# Patient Record
Sex: Male | Born: 1980 | Race: Black or African American | Hispanic: No | Marital: Single | Smoking: Never smoker
Health system: Southern US, Community
[De-identification: ages and names within clinical notes are randomized; demographics above are authoritative.]

## PROBLEM LIST (undated history)

## (undated) DIAGNOSIS — I10 Essential (primary) hypertension: Secondary | ICD-10-CM

---

## 2000-06-30 ENCOUNTER — Emergency Department (HOSPITAL_COMMUNITY): Admission: EM | Admit: 2000-06-30 | Discharge: 2000-07-01 | Payer: Self-pay | Admitting: Emergency Medicine

## 2005-05-28 ENCOUNTER — Emergency Department (HOSPITAL_COMMUNITY): Admission: EM | Admit: 2005-05-28 | Discharge: 2005-05-29 | Payer: Self-pay | Admitting: Emergency Medicine

## 2005-12-18 ENCOUNTER — Emergency Department (HOSPITAL_COMMUNITY): Admission: EM | Admit: 2005-12-18 | Discharge: 2005-12-18 | Payer: Self-pay | Admitting: Emergency Medicine

## 2006-04-25 ENCOUNTER — Ambulatory Visit: Payer: Self-pay | Admitting: Family Medicine

## 2006-04-25 DIAGNOSIS — I10 Essential (primary) hypertension: Secondary | ICD-10-CM

## 2006-04-25 DIAGNOSIS — K219 Gastro-esophageal reflux disease without esophagitis: Secondary | ICD-10-CM | POA: Insufficient documentation

## 2006-04-25 DIAGNOSIS — E785 Hyperlipidemia, unspecified: Secondary | ICD-10-CM | POA: Insufficient documentation

## 2006-04-26 ENCOUNTER — Encounter (INDEPENDENT_AMBULATORY_CARE_PROVIDER_SITE_OTHER): Payer: Self-pay | Admitting: Family Medicine

## 2006-05-01 ENCOUNTER — Encounter (INDEPENDENT_AMBULATORY_CARE_PROVIDER_SITE_OTHER): Payer: Self-pay | Admitting: Family Medicine

## 2006-05-01 LAB — CONVERTED CEMR LAB
ALT: 16 units/L (ref 0–53)
Albumin: 4.3 g/dL (ref 3.5–5.2)
BUN: 14 mg/dL (ref 6–23)
Eosinophils Relative: 2 % (ref 0–5)
HCT: 44.4 % — ABNORMAL HIGH (ref 33.0–44.0)
Hemoglobin: 14.5 g/dL (ref 11.0–14.6)
Lymphocytes Relative: 41 % (ref 31–63)
Lymphs Abs: 2.3 10*3/uL (ref 1.5–7.5)
Monocytes Absolute: 0.4 10*3/uL (ref 0.2–1.2)
Neutrophils Relative %: 49 % (ref 33–67)
Platelets: 348 10*3/uL (ref 190–420)
Potassium: 4.2 meq/L (ref 3.5–5.3)
RBC: 4.7 M/uL (ref 3.80–5.20)
RDW: 13.6 % (ref 11.3–13.6)
Total Bilirubin: 0.6 mg/dL (ref 0.3–1.2)
Total CHOL/HDL Ratio: 5.9
Total Protein: 7.4 g/dL (ref 6.0–8.3)
Triglycerides: 123 mg/dL (ref <150)

## 2006-05-23 ENCOUNTER — Ambulatory Visit: Payer: Self-pay | Admitting: Family Medicine

## 2006-05-24 ENCOUNTER — Encounter (INDEPENDENT_AMBULATORY_CARE_PROVIDER_SITE_OTHER): Payer: Self-pay | Admitting: Family Medicine

## 2006-05-24 LAB — CONVERTED CEMR LAB
BUN: 18 mg/dL (ref 6–23)
CO2: 19 meq/L (ref 19–32)
Calcium: 9.5 mg/dL (ref 8.4–10.5)
Creatinine, Ser: 1.36 mg/dL (ref 0.40–1.50)
Glucose, Bld: 72 mg/dL (ref 70–99)

## 2006-06-20 ENCOUNTER — Encounter (INDEPENDENT_AMBULATORY_CARE_PROVIDER_SITE_OTHER): Payer: Self-pay | Admitting: Family Medicine

## 2006-12-16 ENCOUNTER — Ambulatory Visit: Payer: Self-pay | Admitting: Family Medicine

## 2006-12-16 DIAGNOSIS — T22019A Burn of unspecified degree of unspecified forearm, initial encounter: Secondary | ICD-10-CM | POA: Insufficient documentation

## 2006-12-16 LAB — CONVERTED CEMR LAB: LDL Goal: 130 mg/dL

## 2008-03-05 ENCOUNTER — Ambulatory Visit: Payer: Self-pay | Admitting: Family Medicine

## 2008-03-05 DIAGNOSIS — R252 Cramp and spasm: Secondary | ICD-10-CM | POA: Insufficient documentation

## 2008-03-11 ENCOUNTER — Encounter (INDEPENDENT_AMBULATORY_CARE_PROVIDER_SITE_OTHER): Payer: Self-pay | Admitting: Family Medicine

## 2008-03-12 LAB — CONVERTED CEMR LAB
Chloride: 103 meq/L (ref 96–112)
Cholesterol: 184 mg/dL (ref 0–200)
Eosinophils Absolute: 0.3 10*3/uL (ref 0.0–0.7)
Eosinophils Relative: 4 % (ref 0–5)
Glucose, Bld: 101 mg/dL — ABNORMAL HIGH (ref 70–99)
LDL Cholesterol: 124 mg/dL — ABNORMAL HIGH (ref 0–99)
Lymphocytes Relative: 30 % (ref 12–46)
Lymphs Abs: 2.1 10*3/uL (ref 0.7–4.0)
MCV: 92.8 fL (ref 78.0–100.0)
Monocytes Relative: 11 % (ref 3–12)
Potassium: 3.8 meq/L (ref 3.5–5.3)
RBC: 4.72 M/uL (ref 4.22–5.81)
TSH: 2.255 microintl units/mL (ref 0.350–4.50)
Total Bilirubin: 0.6 mg/dL (ref 0.3–1.2)
Total CHOL/HDL Ratio: 5.3
Triglycerides: 126 mg/dL (ref <150)
VLDL: 25 mg/dL (ref 0–40)
WBC: 7.2 10*3/uL (ref 4.0–10.5)

## 2008-04-16 ENCOUNTER — Ambulatory Visit: Payer: Self-pay | Admitting: Family Medicine

## 2008-04-16 DIAGNOSIS — R7309 Other abnormal glucose: Secondary | ICD-10-CM

## 2008-04-16 DIAGNOSIS — E786 Lipoprotein deficiency: Secondary | ICD-10-CM | POA: Insufficient documentation

## 2008-04-16 LAB — CONVERTED CEMR LAB
Blood in Urine, dipstick: NEGATIVE
Glucose, Urine, Semiquant: NEGATIVE
Ketones, urine, test strip: NEGATIVE
Specific Gravity, Urine: 1.02
pH: 6

## 2008-05-28 ENCOUNTER — Ambulatory Visit: Payer: Self-pay | Admitting: Family Medicine

## 2008-07-30 ENCOUNTER — Ambulatory Visit: Payer: Self-pay | Admitting: Family Medicine

## 2009-12-28 ENCOUNTER — Emergency Department (HOSPITAL_COMMUNITY)
Admission: EM | Admit: 2009-12-28 | Discharge: 2009-12-28 | Payer: Self-pay | Source: Home / Self Care | Admitting: Emergency Medicine

## 2010-02-05 LAB — CONVERTED CEMR LAB
Bilirubin Urine: NEGATIVE
Glucose, Urine, Semiquant: NEGATIVE
HDL goal, serum: 40 mg/dL
Ketones, urine, test strip: NEGATIVE
Nitrite: NEGATIVE
Protein, U semiquant: NEGATIVE
WBC Urine, dipstick: NEGATIVE

## 2012-10-17 ENCOUNTER — Emergency Department (HOSPITAL_COMMUNITY)
Admission: EM | Admit: 2012-10-17 | Discharge: 2012-10-17 | Disposition: A | Discharge status: Home / Self Care | Payer: Self-pay | Attending: Emergency Medicine | Admitting: Emergency Medicine

## 2012-10-17 ENCOUNTER — Encounter (HOSPITAL_COMMUNITY): Payer: Self-pay | Admitting: Emergency Medicine

## 2012-10-17 DIAGNOSIS — X58XXXA Exposure to other specified factors, initial encounter: Secondary | ICD-10-CM | POA: Insufficient documentation

## 2012-10-17 DIAGNOSIS — M549 Dorsalgia, unspecified: Secondary | ICD-10-CM | POA: Insufficient documentation

## 2012-10-17 DIAGNOSIS — Y929 Unspecified place or not applicable: Secondary | ICD-10-CM | POA: Insufficient documentation

## 2012-10-17 DIAGNOSIS — Y939 Activity, unspecified: Secondary | ICD-10-CM | POA: Insufficient documentation

## 2012-10-17 DIAGNOSIS — T148XXA Other injury of unspecified body region, initial encounter: Secondary | ICD-10-CM | POA: Insufficient documentation

## 2012-10-17 MED ORDER — CYCLOBENZAPRINE HCL 10 MG PO TABS: 10.0000 mg | ORAL_TABLET | Freq: Three times a day (TID) | ORAL | Status: DC | PRN

## 2012-10-17 MED ORDER — DICLOFENAC SODIUM 75 MG PO TBEC: 75.0000 mg | DELAYED_RELEASE_TABLET | Freq: Two times a day (BID) | ORAL | Status: DC

## 2012-10-17 NOTE — ED Notes (Signed)
Patient complaining of knot to left shoulder and pain x 1 week. Denies injury.

## 2012-10-19 NOTE — ED Provider Notes (Signed)
Medical screening examination/treatment/procedure(s) were performed by non-physician practitioner and as supervising physician I was immediately available for consultation/collaboration.    Vida Roller, MD 10/19/12 2258

## 2012-10-19 NOTE — ED Provider Notes (Signed)
CSN: [id number]     Arrival date & time 10/17/12  1850 History   First MD Initiated Contact with Patient 10/17/12 1904     Chief Complaint  Patient presents with  . Shoulder Pain   (Consider location/radiation/quality/duration/timing/severity/associated sxs/prior Treatment) Patient is a 32 y.o. male presenting with shoulder pain. The history is provided by the patient.  Shoulder Pain This is a new problem. Episode onset: one week. The problem occurs constantly. The problem has been unchanged. Associated symptoms include myalgias. Pertinent negatives include no abdominal pain, chest pain, chills, congestion, diaphoresis, fever, headaches, joint swelling, nausea, neck pain, numbness, rash, sore throat, vertigo, visual change, vomiting or weakness. The symptoms are aggravated by twisting (bending, stretching and movement of the left arm). He has tried nothing for the symptoms. The treatment provided no relief.    History reviewed. No pertinent past medical history. History reviewed. No pertinent past surgical history. History reviewed. No pertinent family history. History  Substance Use Topics  . Smoking status: Never Smoker   . Smokeless tobacco: Not on file  . Alcohol Use: Yes     Comment: occasional    Review of Systems  Constitutional: Negative for fever, chills and diaphoresis.  HENT: Negative for congestion and sore throat.   Respiratory: Negative for chest tightness and shortness of breath.   Cardiovascular: Negative for chest pain.  Gastrointestinal: Negative for nausea, vomiting and abdominal pain.  Genitourinary: Negative for dysuria and difficulty urinating.  Musculoskeletal: Positive for back pain and myalgias. Negative for joint swelling, neck pain and neck stiffness.  Skin: Negative for color change, rash and wound.  Neurological: Negative for dizziness, vertigo, weakness, numbness and headaches.  All other systems reviewed and are negative.    Allergies  Review of  patient's allergies indicates no known allergies.  Home Medications   Current Outpatient Rx  Name  Route  Sig  Dispense  Refill  . cyclobenzaprine (FLEXERIL) 10 MG tablet   Oral   Take 1 tablet (10 mg total) by mouth 3 (three) times daily as needed.   21 tablet   0   . diclofenac (VOLTAREN) 75 MG EC tablet   Oral   Take 1 tablet (75 mg total) by mouth 2 (two) times daily. Take with food   14 tablet   0    BP 162/79  Pulse 89  Temp(Src) 98.1 F (36.7 C) (Oral)  Resp 16  Ht 6' (1.829 m)  Wt 274 lb (124.286 kg)  BMI 37.15 kg/m2  SpO2 99% Physical Exam  Nursing note and vitals reviewed. Constitutional: He is oriented to person, place, and time. He appears well-developed and well-nourished. No distress.  HENT:  Head: Normocephalic and atraumatic.  Neck: Normal range of motion. Neck supple. No thyromegaly present.  Cardiovascular: Normal rate, regular rhythm, normal heart sounds and intact distal pulses.   No murmur heard. Pulmonary/Chest: Effort normal and breath sounds normal. No respiratory distress. He exhibits no tenderness.  Musculoskeletal: He exhibits tenderness. He exhibits no edema.       Back:  Localized ttp along the muscular border of the left scapula.    Radial pulse is brisk, distal sensation intact, CR< 2 sec. Grip strength is strong and symmetrical.   Pt has full ROM of the left shoulder and neck.  No erythema or bony deformities.     Lymphadenopathy:    He has no cervical adenopathy.  Neurological: He is alert and oriented to person, place, and time. He has normal strength. No sensory  deficit. He exhibits normal muscle tone. Coordination and gait normal.  Reflex Scores:      Tricep reflexes are 2+ on the right side and 2+ on the left side.      Bicep reflexes are 2+ on the right side and 2+ on the left side. Skin: Skin is warm and dry.    ED Course  Procedures (including critical care time) Labs Review Labs Reviewed - No data to display Imaging  Review No results found.    MDM   1. Muscle strain      Pt has localized ttp of the left scapula border.  Pt has full ROM of the left arm and neck.  No focal neuro deficits, non-toxic appearing.  Sx's are c/w muscular strain.  No hx of trauma to indicate need for imaging at this time.  Will treat symptomatically with flexeril and voltaren.  Pt agrees to ice, rest and ortho f/u with not improving.  Pt appears stable for discharge and agrees to care plan.    Paiton Fosco L. Trisha Mangle, PA-C 10/19/12 1257

## 2012-11-16 ENCOUNTER — Emergency Department (HOSPITAL_COMMUNITY)
Admission: EM | Admit: 2012-11-16 | Discharge: 2012-11-16 | Disposition: A | Discharge status: Home / Self Care | Payer: Managed Care, Other (non HMO) | Attending: Emergency Medicine | Admitting: Emergency Medicine

## 2012-11-16 ENCOUNTER — Encounter (HOSPITAL_COMMUNITY): Payer: Self-pay | Admitting: Emergency Medicine

## 2012-11-16 ENCOUNTER — Emergency Department (HOSPITAL_COMMUNITY): Payer: Managed Care, Other (non HMO)

## 2012-11-16 DIAGNOSIS — W230XXA Caught, crushed, jammed, or pinched between moving objects, initial encounter: Secondary | ICD-10-CM | POA: Insufficient documentation

## 2012-11-16 DIAGNOSIS — Z23 Encounter for immunization: Secondary | ICD-10-CM | POA: Insufficient documentation

## 2012-11-16 DIAGNOSIS — Z791 Long term (current) use of non-steroidal anti-inflammatories (NSAID): Secondary | ICD-10-CM | POA: Insufficient documentation

## 2012-11-16 DIAGNOSIS — S6000XA Contusion of unspecified finger without damage to nail, initial encounter: Secondary | ICD-10-CM | POA: Insufficient documentation

## 2012-11-16 DIAGNOSIS — Y990 Civilian activity done for income or pay: Secondary | ICD-10-CM | POA: Insufficient documentation

## 2012-11-16 DIAGNOSIS — Y9389 Activity, other specified: Secondary | ICD-10-CM | POA: Insufficient documentation

## 2012-11-16 DIAGNOSIS — Y9289 Other specified places as the place of occurrence of the external cause: Secondary | ICD-10-CM | POA: Insufficient documentation

## 2012-11-16 DIAGNOSIS — S62609A Fracture of unspecified phalanx of unspecified finger, initial encounter for closed fracture: Secondary | ICD-10-CM

## 2012-11-16 DIAGNOSIS — S62639A Displaced fracture of distal phalanx of unspecified finger, initial encounter for closed fracture: Secondary | ICD-10-CM | POA: Insufficient documentation

## 2012-11-16 DIAGNOSIS — S6010XA Contusion of unspecified finger with damage to nail, initial encounter: Secondary | ICD-10-CM

## 2012-11-16 MED ORDER — TETANUS-DIPHTH-ACELL PERTUSSIS 5-2.5-18.5 LF-MCG/0.5 IM SUSP
0.5000 mL | Freq: Once | INTRAMUSCULAR | Status: AC
  Administered 2012-11-16: 0.5 mL via INTRAMUSCULAR
  Filled 2012-11-16: qty 0.5

## 2012-11-16 NOTE — ED Notes (Signed)
Pt was rolling a cart at work when he went to stop the cart from rolling too fast smashing index, middle and ring finger on left hand, c/o pain and swelling to left middle finger,

## 2012-11-16 NOTE — ED Provider Notes (Signed)
Medical screening examination/treatment/procedure(s) were performed by non-physician practitioner and as supervising physician I was immediately available for consultation/collaboration.  EKG Interpretation   None         Anis Cinelli L Syndi Pua, MD 11/16/12 2342 

## 2012-11-16 NOTE — ED Provider Notes (Signed)
CSN: [id number]     Arrival date & time 11/16/12  1728 History   First MD Initiated Contact with Patient 11/16/12 1742     Chief Complaint  Patient presents with  . Finger Injury   (Consider location/radiation/quality/duration/timing/severity/associated sxs/prior Treatment) Patient is a 32 y.o. male presenting with hand pain. The history is provided by the patient.  Hand Pain This is a new problem. The current episode started in the past 7 days. The problem has been gradually worsening.   Carlos Griffin is a 32 y.o. male who presents to the ED with pain in his middle finger of the left hand after he mashed it in a shopping cart 4 days ago. He has swelling and blood under his finer nail. He did hut the index and ring finger but they are fine now. Denies any other injuries.   History reviewed. No pertinent past medical history. History reviewed. No pertinent past surgical history. No family history on file. History  Substance Use Topics  . Smoking status: Never Smoker   . Smokeless tobacco: Not on file  . Alcohol Use: Yes     Comment: occasional    Review of Systems Negative except as stated in HPI  Allergies  Review of patient's allergies indicates no known allergies.  Home Medications   Current Outpatient Rx  Name  Route  Sig  Dispense  Refill  . cyclobenzaprine (FLEXERIL) 10 MG tablet   Oral   Take 1 tablet (10 mg total) by mouth 3 (three) times daily as needed.   21 tablet   0   . diclofenac (VOLTAREN) 75 MG EC tablet   Oral   Take 1 tablet (75 mg total) by mouth 2 (two) times daily. Take with food   14 tablet   0    BP 155/92  Pulse 85  Temp(Src) 98.1 F (36.7 C) (Oral)  Resp 20  Ht 6' (1.829 m)  Wt 260 lb (117.935 kg)  BMI 35.25 kg/m2  SpO2 100% Physical Exam  Nursing note and vitals reviewed. Constitutional: He is oriented to person, place, and time. He appears well-developed and well-nourished. No distress.  HENT:  Head: Normocephalic and  atraumatic.  Eyes: EOM are normal.  Neck: Neck supple.  Cardiovascular: Normal rate.   Pulmonary/Chest: Effort normal.  Musculoskeletal:       Left hand: He exhibits tenderness and swelling. He exhibits normal range of motion and no deformity. Normal sensation noted. Normal strength noted.       Hands: subungual hematoma  Neurological: He is alert and oriented to person, place, and time. No cranial nerve deficit.  Skin: Skin is warm and dry.  Psychiatric: He has a normal mood and affect. His behavior is normal.    ED Course  Procedures Long finger left hand cleaned, using cautery small hole made in nail and subungual hematoma drained. Patient tolerated the procedure well without any immediate complications.   Dg Finger Middle Left  11/16/2012   CLINICAL DATA:  Left middle finger injury  EXAM: LEFT MIDDLE FINGER 2+V  COMPARISON:  None.  FINDINGS: Three views of the left 3rd finger submitted. There is nondisplaced fracture at the tip of distal phalanx.  IMPRESSION: Nondisplaced fracture at the tip of distal phalanx.   Electronically Signed   By: Natasha Mead M.D.   On: 11/16/2012 18:30    MDM  32 y.o. male with subungual hematoma of the left middle finger and fracture of the distal phalanx. Splint, ice and elevation and  follow up with ortho.  I have reviewed this patient's vital signs, nurses notes, appropriate labs and imaging.  I have discussed findings with the patient and plan of care and he voices understanding.    Medication List    ASK your doctor about these medications       cyclobenzaprine 10 MG tablet  Commonly known as:  FLEXERIL  Take 1 tablet (10 mg total) by mouth 3 (three) times daily as needed.     diclofenac 75 MG EC tablet  Commonly known as:  VOLTAREN  Take 1 tablet (75 mg total) by mouth 2 (two) times daily. Take with food           Janne Napoleon, NP 11/16/12 2242

## 2014-04-11 ENCOUNTER — Encounter (HOSPITAL_COMMUNITY): Payer: Self-pay | Admitting: Cardiology

## 2014-04-11 ENCOUNTER — Emergency Department (HOSPITAL_COMMUNITY)
Admission: EM | Admit: 2014-04-11 | Discharge: 2014-04-11 | Disposition: A | Discharge status: Home / Self Care | Payer: Managed Care, Other (non HMO) | Attending: Emergency Medicine | Admitting: Emergency Medicine

## 2014-04-11 DIAGNOSIS — Z792 Long term (current) use of antibiotics: Secondary | ICD-10-CM | POA: Insufficient documentation

## 2014-04-11 DIAGNOSIS — J020 Streptococcal pharyngitis: Secondary | ICD-10-CM

## 2014-04-11 DIAGNOSIS — J029 Acute pharyngitis, unspecified: Secondary | ICD-10-CM | POA: Insufficient documentation

## 2014-04-11 LAB — RAPID STREP SCREEN (MED CTR MEBANE ONLY): Streptococcus, Group A Screen (Direct): POSITIVE — AB

## 2014-04-11 MED ORDER — AMOXICILLIN 500 MG PO CAPS: 500.0000 mg | ORAL_CAPSULE | Freq: Three times a day (TID) | ORAL | Status: DC

## 2014-04-11 NOTE — ED Provider Notes (Signed)
CSN: [id number]     Arrival date & time 04/11/14  1051 History   This chart was scribed for non-physician practitioner Kerrie BuffaloHope Sirena Riddle, NP working with Donnetta HutchingBrian Cook, MD by Murriel HopperAlec Bankhead, ED Scribe. This patient was seen in room APFT23/APFT23 and the patient's care was started at 12:20 PM.    Chief Complaint  Patient presents with  . Sore Throat  . Chills      The history is provided by the patient. No language interpreter was used.     HPI Comments: Carlos Griffin is a 34 y.o. male who presents to the Emergency Department complaining of a sore throat with associated chills, fever that has been present since last night. Pt denies positive sick contact and denies ear pain, cough, and congestion.    History reviewed. No pertinent past medical history. History reviewed. No pertinent past surgical history. History reviewed. No pertinent family history. History  Substance Use Topics  . Smoking status: Never Smoker   . Smokeless tobacco: Not on file  . Alcohol Use: Yes     Comment: occasional    Review of Systems  Constitutional: Positive for fever and chills.  HENT: Positive for sore throat. Negative for congestion and ear pain.   Respiratory: Negative for cough.       Allergies  Review of patient's allergies indicates no known allergies.  Home Medications   Prior to Admission medications   Medication Sig Start Date End Date Taking? Authorizing Provider  amoxicillin (AMOXIL) 500 MG capsule Take 1 capsule (500 mg total) by mouth 3 (three) times daily. 04/11/14   Ady Heimann Orlene OchM Jayliana Valencia, NP  cyclobenzaprine (FLEXERIL) 10 MG tablet Take 1 tablet (10 mg total) by mouth 3 (three) times daily as needed. Patient not taking: Reported on 04/11/2014 10/17/12   Tammi Triplett, PA-C  diclofenac (VOLTAREN) 75 MG EC tablet Take 1 tablet (75 mg total) by mouth 2 (two) times daily. Take with food Patient not taking: Reported on 04/11/2014 10/17/12   Tammi Triplett, PA-C   BP 131/69 mmHg  Pulse 106  Temp(Src)  100.2 F (37.9 C) (Oral)  Resp 18  Ht 5\' 11"  (1.803 m)  Wt 280 lb (127.007 kg)  BMI 39.07 kg/m2  SpO2 99% Physical Exam  Constitutional: He is oriented to person, place, and time. He appears well-developed and well-nourished.  HENT:  Head: Normocephalic and atraumatic.  Mouth/Throat: Uvula is midline. Oropharyngeal exudate and posterior oropharyngeal erythema present.  Uvula midline Pharynx is red with   Cervical adenopathy bilateral   Eyes: Pupils are equal, round, and reactive to light.  Cardiovascular: Normal rate and regular rhythm.   Pulmonary/Chest: Effort normal and breath sounds normal. No respiratory distress. He has no wheezes. He has no rales. He exhibits no tenderness.  Abdominal: Soft. He exhibits no distension. There is no tenderness.  Musculoskeletal:  No CVA tenderness  Neurological: He is alert and oriented to person, place, and time.  Skin: Skin is warm and dry.  Psychiatric: He has a normal mood and affect.  Nursing note and vitals reviewed.   ED Course  Procedures (including critical care time)  DIAGNOSTIC STUDIES: Oxygen Saturation is 99% on room air, normal by my interpretation.    COORDINATION OF CARE: 12:23 PM Discussed treatment plan with pt at bedside and pt agreed to plan.   Labs Review Labs Reviewed  RAPID STREP SCREEN - Abnormal; Notable for the following:    Streptococcus, Group A Screen (Direct) POSITIVE (*)    All other components within  normal limits     MDM  34 y.o. male with sore throat and fever x 24 hours. Stable for d/c without difficulty swallowing and does not appear toxic. Will treat strep with antibiotics and he will return for worsening symptoms.   Final diagnoses:  Strep pharyngitis   I personally performed the services described in this documentation, which was scribed in my presence. The recorded information has been reviewed and is accurate.   [address] [place], NP 04/12/14 Ernestina Columbia  Donnetta Hutching, MD 04/13/14 1255

## 2014-04-11 NOTE — Discharge Instructions (Signed)
Take ibuprofen regularly for the next few days for pain and fever. Return as needed.

## 2014-04-11 NOTE — ED Notes (Signed)
Sorethroat,  Chills and achy since yesterday.

## 2014-12-16 ENCOUNTER — Encounter (HOSPITAL_COMMUNITY): Payer: Self-pay | Admitting: Emergency Medicine

## 2014-12-16 ENCOUNTER — Emergency Department (HOSPITAL_COMMUNITY)
Admission: EM | Admit: 2014-12-16 | Discharge: 2014-12-16 | Disposition: A | Discharge status: Home / Self Care | Payer: Managed Care, Other (non HMO) | Attending: Emergency Medicine | Admitting: Emergency Medicine

## 2014-12-16 DIAGNOSIS — Z792 Long term (current) use of antibiotics: Secondary | ICD-10-CM | POA: Diagnosis not present

## 2014-12-16 DIAGNOSIS — I10 Essential (primary) hypertension: Secondary | ICD-10-CM

## 2014-12-16 DIAGNOSIS — R51 Headache: Secondary | ICD-10-CM | POA: Diagnosis present

## 2014-12-16 LAB — I-STAT CHEM 8, ED
BUN: 17 mg/dL (ref 6–20)
CALCIUM ION: 1.11 mmol/L — AB (ref 1.12–1.23)
CHLORIDE: 99 mmol/L — AB (ref 101–111)
CREATININE: 1.4 mg/dL — AB (ref 0.61–1.24)
GLUCOSE: 90 mg/dL (ref 65–99)
HCT: 47 % (ref 39.0–52.0)
Hemoglobin: 16 g/dL (ref 13.0–17.0)
Potassium: 3.5 mmol/L (ref 3.5–5.1)
SODIUM: 139 mmol/L (ref 135–145)
TCO2: 27 mmol/L (ref 0–100)

## 2014-12-16 MED ORDER — LISINOPRIL 20 MG PO TABS: 20.0000 mg | ORAL_TABLET | Freq: Every day | ORAL | Status: DC

## 2014-12-16 MED ORDER — CLONIDINE HCL 0.1 MG PO TABS
0.1000 mg | ORAL_TABLET | Freq: Once | ORAL | Status: AC
  Administered 2014-12-16: 0.1 mg via ORAL
  Filled 2014-12-16: qty 1

## 2014-12-16 NOTE — ED Notes (Signed)
Patient verbalizes understanding of discharge instructions, prescription medications, home care and follow up care. Patient ambulatory out of department at this time. 

## 2014-12-16 NOTE — Discharge Instructions (Signed)
Follow up with a family md in 1-2 weeks °

## 2014-12-16 NOTE — ED Provider Notes (Signed)
CSN: [id number]     Arrival date & time 12/16/14  2107 History  By signing my name below, I, Murriel HopperAlec Bankhead, attest that this documentation has been prepared under the direction and in the presence of Bethann BerkshireJoseph Chistian Kasler, MD. Electronically Signed: Murriel HopperAlec Bankhead, ED Scribe. 12/16/2014. 9:29 PM.    Chief Complaint  Patient presents with  . Headache  . Hypertension     Patient is a 34 y.o. male presenting with headaches and hypertension. The history is provided by the patient. No language interpreter was used.  Headache Pain location:  Generalized Radiates to:  Does not radiate Onset quality:  Gradual Duration:  4 days Timing:  Constant Progression:  Worsening Chronicity:  New Relieved by:  None tried Worsened by:  Nothing Ineffective treatments:  None tried Associated symptoms: no abdominal pain, no back pain, no congestion, no cough, no diarrhea, no fatigue, no nausea, no seizures, no sinus pressure, no vomiting and no weakness   Hypertension Associated symptoms include headaches. Pertinent negatives include no chest pain and no abdominal pain.   HPI Comments: Carlos Griffin is a 34 y.o. male who presents to the Emergency Department complaining of constant, worsening headache with associated hypertensive episodes that have been present for three datys. Pt reports his blood pressure was around 170/106 and states he does not take any medication for his HTN. Pt states he was prescribed HTN medication at one point but never got it filled as he states "I felt like I could control it on my own". Pt states he has a PCP. Pt denies weakness, visual changes, nausea, vomiting.   History reviewed. No pertinent past medical history. History reviewed. No pertinent past surgical history. History reviewed. No pertinent family history. Social History  Substance Use Topics  . Smoking status: Never Smoker   . Smokeless tobacco: None  . Alcohol Use: No     Comment: occasional    Review of Systems   Constitutional: Negative for appetite change and fatigue.  HENT: Negative for congestion, ear discharge and sinus pressure.   Eyes: Negative for discharge and visual disturbance.  Respiratory: Negative for cough.   Cardiovascular: Negative for chest pain.  Gastrointestinal: Negative for nausea, vomiting, abdominal pain and diarrhea.  Genitourinary: Negative for frequency and hematuria.  Musculoskeletal: Negative for back pain.  Skin: Negative for rash.  Neurological: Positive for headaches. Negative for seizures and weakness.  Psychiatric/Behavioral: Negative for hallucinations.      Allergies  Review of patient's allergies indicates no known allergies.  Home Medications   Prior to Admission medications   Medication Sig Start Date End Date Taking? Authorizing Provider  amoxicillin (AMOXIL) 500 MG capsule Take 1 capsule (500 mg total) by mouth 3 (three) times daily. 04/11/14   Hope Orlene OchM Neese, NP  cyclobenzaprine (FLEXERIL) 10 MG tablet Take 1 tablet (10 mg total) by mouth 3 (three) times daily as needed. Patient not taking: Reported on 04/11/2014 10/17/12   Tammy Triplett, PA-C  diclofenac (VOLTAREN) 75 MG EC tablet Take 1 tablet (75 mg total) by mouth 2 (two) times daily. Take with food Patient not taking: Reported on 04/11/2014 10/17/12   Tammy Triplett, PA-C   BP 185/112 mmHg  Pulse 78  Temp(Src) 97.7 F (36.5 C) (Oral)  Resp 18  Ht 5\' 11"  (1.803 m)  Wt 306 lb (138.801 kg)  BMI 42.70 kg/m2  SpO2 100% Physical Exam  Constitutional: He is oriented to person, place, and time. He appears well-developed.  HENT:  Head: Normocephalic.  Eyes: Conjunctivae  and EOM are normal. No scleral icterus.  Neck: Neck supple. No thyromegaly present.  Cardiovascular: Normal rate and regular rhythm.  Exam reveals no gallop and no friction rub.   No murmur heard. Pulmonary/Chest: No stridor. He has no wheezes. He has no rales. He exhibits no tenderness.  Abdominal: He exhibits no distension. There  is no tenderness. There is no rebound.  Musculoskeletal: Normal range of motion. He exhibits no edema.  Lymphadenopathy:    He has no cervical adenopathy.  Neurological: He is oriented to person, place, and time. He exhibits normal muscle tone. Coordination normal.  Skin: No rash noted. No erythema.  Psychiatric: He has a normal mood and affect. His behavior is normal.    ED Course  Procedures (including critical care time)  DIAGNOSTIC STUDIES: Oxygen Saturation is 100% on room air, normal by my interpretation.    COORDINATION OF CARE: 9:27 PM Discussed treatment plan with pt at bedside and pt agreed to plan.   Labs Review Labs Reviewed - No data to display  Imaging Review No results found. I have personally reviewed and evaluated these images and lab results as part of my medical decision-making.   EKG Interpretation None      MDM   Final diagnoses:  None   Patient with hypertension. Will start patient on lisinopril and he is follow-up with family M.D.The chart was scribed for me under my direct supervision.  I personally performed the history, physical, and medical decision making and all procedures in the evaluation of this patient.Bethann Berkshire, MD 12/16/14 [phone]

## 2014-12-16 NOTE — ED Notes (Signed)
Pt states he has been having a HA for the past three days and pt states his BP was "170s/106". Pt denies weakness, visual changes, N/V.

## 2015-03-07 ENCOUNTER — Ambulatory Visit: Payer: Self-pay | Admitting: Physician Assistant

## 2015-07-21 ENCOUNTER — Ambulatory Visit (INDEPENDENT_AMBULATORY_CARE_PROVIDER_SITE_OTHER): Payer: Managed Care, Other (non HMO) | Admitting: Physician Assistant

## 2015-07-21 ENCOUNTER — Encounter: Payer: Self-pay | Admitting: Physician Assistant

## 2015-07-21 VITALS — BP 164/114 | HR 72 | Temp 98.1°F | Resp 18 | Ht 71.0 in | Wt 290.0 lb

## 2015-07-21 DIAGNOSIS — E785 Hyperlipidemia, unspecified: Secondary | ICD-10-CM | POA: Diagnosis not present

## 2015-07-21 DIAGNOSIS — I10 Essential (primary) hypertension: Secondary | ICD-10-CM | POA: Diagnosis not present

## 2015-07-21 DIAGNOSIS — R7309 Other abnormal glucose: Secondary | ICD-10-CM

## 2015-07-21 DIAGNOSIS — Z0000 Encounter for general adult medical examination without abnormal findings: Secondary | ICD-10-CM

## 2015-07-21 LAB — COMPLETE METABOLIC PANEL WITH GFR
ALT: 15 U/L (ref 9–46)
AST: 16 U/L (ref 10–40)
Albumin: 4.2 g/dL (ref 3.6–5.1)
Alkaline Phosphatase: 52 U/L (ref 40–115)
BUN: 13 mg/dL (ref 7–25)
CHLORIDE: 104 mmol/L (ref 98–110)
CO2: 26 mmol/L (ref 20–31)
Calcium: 9.1 mg/dL (ref 8.6–10.3)
Creat: 1.49 mg/dL — ABNORMAL HIGH (ref 0.60–1.35)
GFR, EST AFRICAN AMERICAN: 69 mL/min (ref >=60)
GFR, EST NON AFRICAN AMERICAN: 60 mL/min (ref >=60)
GLUCOSE: 110 mg/dL — AB (ref 70–99)
POTASSIUM: 3.7 mmol/L (ref 3.5–5.3)
SODIUM: 139 mmol/L (ref 135–146)
TOTAL PROTEIN: 6.9 g/dL (ref 6.1–8.1)
Total Bilirubin: 0.6 mg/dL (ref 0.2–1.2)

## 2015-07-21 LAB — LIPID PANEL
CHOL/HDL RATIO: 5 ratio (ref <=5.0)
Cholesterol: 208 mg/dL — ABNORMAL HIGH (ref 125–200)
HDL: 42 mg/dL (ref >=40)
LDL CALC: 142 mg/dL — AB (ref <130)
TRIGLYCERIDES: 122 mg/dL (ref <150)
VLDL: 24 mg/dL (ref <30)

## 2015-07-21 LAB — CBC WITH DIFFERENTIAL/PLATELET
BASOS ABS: 0 {cells}/uL (ref 0–200)
Basophils Relative: 0 %
EOS PCT: 2 %
Eosinophils Absolute: 148 cells/uL (ref 15–500)
HCT: 43.7 % (ref 38.5–50.0)
Hemoglobin: 14.5 g/dL (ref 13.0–17.0)
LYMPHS PCT: 28 %
Lymphs Abs: 2072 cells/uL (ref 850–3900)
MCH: 30.7 pg (ref 27.0–33.0)
MCHC: 33.2 g/dL (ref 32.0–36.0)
MCV: 92.4 fL (ref 80.0–100.0)
MONOS PCT: 6 %
MPV: 10 fL (ref 7.5–12.5)
Monocytes Absolute: 444 cells/uL (ref 200–950)
NEUTROS ABS: 4736 {cells}/uL (ref 1500–7800)
Neutrophils Relative %: 64 %
PLATELETS: 337 10*3/uL (ref 140–400)
RBC: 4.73 MIL/uL (ref 4.20–5.80)
RDW: 13.3 % (ref 11.0–15.0)
WBC: 7.4 10*3/uL (ref 3.8–10.8)

## 2015-07-21 LAB — HEMOGLOBIN A1C
HEMOGLOBIN A1C: 5.1 % (ref <5.7)
Mean Plasma Glucose: 100 mg/dL

## 2015-07-21 LAB — TSH: TSH: 1.38 mIU/L (ref 0.40–4.50)

## 2015-07-21 MED ORDER — LISINOPRIL 40 MG PO TABS: 40.0000 mg | ORAL_TABLET | Freq: Every day | ORAL | Status: DC

## 2015-07-21 MED ORDER — AMLODIPINE BESYLATE 5 MG PO TABS: 5.0000 mg | ORAL_TABLET | Freq: Every day | ORAL | Status: DC

## 2015-07-21 NOTE — Progress Notes (Signed)
Patient ID: Carlos Griffin MRN: [id number], DOB: 03-20-1980 35 y.o. Date of Encounter: 07/21/2015, 8:55 AM    Chief Complaint: Physical (CPE)  HPI: 35 y.o. y/o AA male here for CPE.    -----------AT NEXT OV WILL REVIEW/DOCUMENT FAMILY HISTORY---------------------------- Reviewed that he is taking lisinopril 36 mg daily on his medication list. Verified-- he states that he IS taking this every day as directed. Says that he has been on this for 4-5 months. Says that he was seeing Dr. Felecia Shelling in [place] regarding this. Says that he has not been on any other blood pressure medicines in the past and has had no adverse effects to any other blood pressure medicines.  Also reviewed that his problem list includes hyperlipidemia. Says that this is just been treated with "diet" and has never been on medicines for this. Asked when this was last checked and he says at his wellness program at work at around January of this year.  Also discussed his weight and his diet regarding the hyperlipidemia. Asked whether his diet is better worse or the same. He says it has improved some. I also asked if his weight is about the same or if it is much different than it was 5 years ago. Says that it's about the same.  Discussed that his blood pressure is reading high today. States that he has checked his blood pressure at work and at home and has been getting similar readings.  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 His 74 year old son lives with him. It is just the 2 of them. Does make mention that his son's mother lives in [place].   Review of Systems: Consitutional: No fever, chills, fatigue, night sweats, lymphadenopathy, or weight changes. Eyes: No visual changes, eye redness, or discharge. ENT/Mouth: Ears: No otalgia, tinnitus, hearing loss, discharge. Nose: No congestion, rhinorrhea, sinus pain, or epistaxis. Throat: No sore throat, post nasal  drip, or teeth pain. Cardiovascular: No CP, palpitations, diaphoresis, DOE, edema, orthopnea, PND. Respiratory: No cough, hemoptysis, SOB, or wheezing. Gastrointestinal: No anorexia, dysphagia, reflux, pain, nausea, vomiting, hematemesis, diarrhea, constipation, BRBPR, or melena. Genitourinary: No dysuria, frequency, urgency, hematuria, incontinence, nocturia, decreased urinary stream, discharge, impotence, or testicular pain/masses. Musculoskeletal: No decreased ROM, myalgias, stiffness, joint swelling, or weakness. Skin: No rash, erythema, lesion changes, pain, warmth, jaundice, or pruritis. Neurological: No headache, dizziness, syncope, seizures, tremors, memory loss, coordination problems, or paresthesias. Psychological: No anxiety, depression, hallucinations, SI/HI. Endocrine: No fatigue, polydipsia, polyphagia, polyuria, or known diabetes. All other systems were reviewed and are otherwise negative.  No past medical history on file.   No past surgical history on file.  Home Meds:  Outpatient Prescriptions Prior to Visit  Medication Sig Dispense Refill  . lisinopril (PRINIVIL,ZESTRIL) 20 MG tablet Take 1 tablet (20 mg total) by mouth daily. 30 tablet 0  . amoxicillin (AMOXIL) 500 MG capsule Take 1 capsule (500 mg total) by mouth 3 (three) times daily. (Patient not taking: Reported on 12/16/2014) 30 capsule 0  . cyclobenzaprine (FLEXERIL) 10 MG tablet Take 1 tablet (10 mg total) by mouth 3 (three) times daily as needed. (Patient not taking: Reported on 04/11/2014) 21 tablet 0  . diclofenac (VOLTAREN) 75 MG EC tablet Take 1 tablet (75 mg total) by mouth 2 (two) times daily. Take with food (Patient not taking: Reported on 04/11/2014) 14 tablet 0   No facility-administered medications prior to visit.    Allergies: No Known  Allergies  Social History   Social History  . Marital Status: Single    Spouse Name: N/A  . Number of Children: N/A  . Years of Education: N/A   Occupational  History  . Not on file.   Social History Main Topics  . Smoking status: Never Smoker   . Smokeless tobacco: Never Used  . Alcohol Use: No     Comment: occasional  . Drug Use: No  . Sexual Activity: Not on file   Other Topics Concern  . Not on file   Social History Narrative    No family history on file.  Physical Exam: Blood pressure 164/114, pulse 72, temperature 98.1 F (36.7 C), temperature source Oral, resp. rate 18, height 5\' 11"  (1.803 m), weight 290 lb (131.543 kg).  General:  Obese AAM. Appears in no acute distress. HEENT: Normocephalic, atraumatic. Conjunctiva pink, sclera non-icteric. Pupils 2 mm constricting to 1 mm, round, regular, and equally reactive to light and accomodation. EOMI. Internal auditory canal clear. TMs with good cone of light and without pathology. Nasal mucosa pink. Nares are without discharge. No sinus tenderness. Oral mucosa pink.. Pharynx without exudate.   Neck: Supple. Trachea midline. No thyromegaly. Full ROM. No lymphadenopathy. Lungs: Clear to auscultation bilaterally without wheezes, rales, or rhonchi. Breathing is of normal effort and unlabored. Cardiovascular: RRR with S1 S2. No murmurs, rubs, or gallops. Distal pulses 2+ symmetrically. No carotid or abdominal bruits. No LE edema. Abdomen: Soft, non-tender, non-distended with normoactive bowel sounds. No hepatosplenomegaly or masses. No rebound/guarding. No CVA tenderness. No hernias. Musculoskeletal: Full range of motion and 5/5 strength throughout. Without swelling. Skin: Warm and moist without erythema, ecchymosis, wounds, or rash. Neuro: A+Ox3. CN II-XII grossly intact. Moves all extremities spontaneously. Full sensation throughout. Normal gait. DTR 2+ throughout upper and lower extremities.  Psych:  Responds to questions appropriately with a normal affect.   Assessment/Plan:  35 y.o. y/o AA male here for CPE  -1. Visit for preventive health examination  A. Screening Labs: - CBC with  Differential/Platelet - COMPLETE METABOLIC PANEL WITH GFR - Lipid panel - Hemoglobin A1c - TSH - VITAMIN D 25 Hydroxy (Vit-D Deficiency, Fractures)   B. Screening For Prostate Cancer: --Will start this at age 35  C. Screening For Colorectal Cancer:  Will start this at age 35  D. Immunizations: Flu---------------N/A Tetanus-------- reports that he had tetanus vaccine in 2014 Pneumococcal-------no indication to require this until age 35 Zostaax---------------will discuss at age 35   2. Essential hypertension Increase lisinopril from 20 mg to 40 mg daily. Add Norvasc 5 mg daily. Return for follow-up office visit in 2 weeks to recheck blood pressure and BM ET. - COMPLETE METABOLIC PANEL WITH GFR - lisinopril (PRINIVIL,ZESTRIL) 40 MG tablet; Take 1 tablet (40 mg total) by mouth daily.  Dispense: 30 tablet; Refill: 0 - amLODipine (NORVASC) 5 MG tablet; Take 1 tablet (5 mg total) by mouth daily.  Dispense: 90 tablet; Refill: 3  3. Hyperlipidemia - COMPLETE METABOLIC PANEL WITH GFR - Lipid panel  4. HYPERGLYCEMIA, FASTING - COMPLETE METABOLIC PANEL WITH GFR - Hemoglobin A1c  5. Morbid obesity due to excess calories (HCC) Will further discuss diet and exercise at future visits especially once we get lab results of his lipids and A1c.    Signed:   [address]Erienne Spelman Beth MorrisdaleDixon,[place], [place]  07/21/2015 8:55 AM

## 2015-07-22 LAB — VITAMIN D 25 HYDROXY (VIT D DEFICIENCY, FRACTURES): Vit D, 25-Hydroxy: 17 ng/mL — ABNORMAL LOW (ref 30–100)

## 2015-07-25 ENCOUNTER — Telehealth: Payer: Self-pay | Admitting: *Deleted

## 2015-07-25 ENCOUNTER — Encounter: Payer: Self-pay | Admitting: *Deleted

## 2015-07-25 DIAGNOSIS — E559 Vitamin D deficiency, unspecified: Secondary | ICD-10-CM | POA: Insufficient documentation

## 2015-07-25 NOTE — Telephone Encounter (Signed)
Tell patient vitamin D level is low. Start over-the-counter vitamin D 2,000 units daily.         Tell patient the cholesterol is a little high but does not have to start medication now but does need to decrease saturated fats in diet.    Other labs normal

## 2015-08-04 ENCOUNTER — Encounter: Payer: Self-pay | Admitting: Physician Assistant

## 2015-08-04 ENCOUNTER — Ambulatory Visit (INDEPENDENT_AMBULATORY_CARE_PROVIDER_SITE_OTHER): Payer: Managed Care, Other (non HMO) | Admitting: Physician Assistant

## 2015-08-04 VITALS — BP 134/86 | HR 80 | Temp 98.1°F | Resp 18 | Wt 287.0 lb

## 2015-08-04 DIAGNOSIS — E559 Vitamin D deficiency, unspecified: Secondary | ICD-10-CM | POA: Diagnosis not present

## 2015-08-04 DIAGNOSIS — I10 Essential (primary) hypertension: Secondary | ICD-10-CM

## 2015-08-04 MED ORDER — VITAMIN D 50 MCG (2000 UT) PO CAPS: 1.0000 | ORAL_CAPSULE | Freq: Every day | ORAL | 11 refills | Status: DC

## 2015-08-04 NOTE — Progress Notes (Signed)
Patient ID: Carlos Griffin MRN: [id number], DOB: 1980-06-26 35 y.o. Date of Encounter: 08/04/2015, 3:06 PM    Chief Complaint: F/U HTN  HPI: 35 y.o. y/o AA male here for f/u HTN.    07/21/2015: Reviewed that he is taking lisinopril 20 mg daily on his medication list. Verified-- he states that he IS taking this every day as directed. Says that he has been on this for 4-5 months. Says that he was seeing Dr. Felecia Shelling in [place] regarding this. Says that he has not been on any other blood pressure medicines in the past and has had no adverse effects to any other blood pressure medicines.  Also reviewed that his problem list includes hyperlipidemia. Says that this is just been treated with "diet" and has never been on medicines for this. Asked when this was last checked and he says at his wellness program at work at around January of this year.  Also discussed his weight and his diet regarding the hyperlipidemia. Asked whether his diet is better worse or the same. He says it has improved some. I also asked if his weight is about the same or if it is much different than it was 5 years ago. Says that it's about the same.  Discussed that his blood pressure is reading high today. States that he has checked his blood pressure at work and at home and has been getting similar readings.  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 His 36 year old son lives with him. It is just the 2 of them. Does make mention that his son's mother lives in [place].  AT THAT OV: --Checked screening labs --Blood pressure was elevated. ------ increased lisinopril from 20 mg to 40 mg daily ------ added Norvasc 5 mg daily   08/04/2015: Today he reports that he did increase the lisinopril and is taking the 40 mg dose now. He also added the Norvasc 5 mg daily. He is having no adverse effects. Today I reviewed the lab results from last visit. He states that he  never was informed of the results and I see that we left a message for him to call back the he does not seem aware of this. Nonetheless today I have told him to start vitamin D 2000 units daily. Reviewed that his other labs looked good. A1c normal at 5.1. TSH normal CBC normal. LDL was slightly elevated at 142. He has no complaints or concerns today.   Review of Systems: Consitutional: No fever, chills, fatigue, night sweats, lymphadenopathy, or weight changes. Eyes: No visual changes, eye redness, or discharge. ENT/Mouth: Ears: No otalgia, tinnitus, hearing loss, discharge. Nose: No congestion, rhinorrhea, sinus pain, or epistaxis. Throat: No sore throat, post nasal drip, or teeth pain. Cardiovascular: No CP, palpitations, diaphoresis, DOE, edema, orthopnea, PND. Respiratory: No cough, hemoptysis, SOB, or wheezing. Gastrointestinal: No anorexia, dysphagia, reflux, pain, nausea, vomiting, hematemesis, diarrhea, constipation, BRBPR, or melena. Genitourinary: No dysuria, frequency, urgency, hematuria, incontinence, nocturia, decreased urinary stream, discharge, impotence, or testicular pain/masses. Musculoskeletal: No decreased ROM, myalgias, stiffness, joint swelling, or weakness. Skin: No rash, erythema, lesion changes, pain, warmth, jaundice, or pruritis. Neurological: No headache, dizziness, syncope, seizures, tremors, memory loss, coordination problems, or paresthesias. Psychological: No anxiety, depression, hallucinations, SI/HI. Endocrine: No fatigue, polydipsia, polyphagia, polyuria, or known diabetes. All other systems were reviewed and are otherwise negative.  No past medical history on file.   No past surgical history on file.  Home Meds:  Outpatient Medications Prior to Visit  Medication Sig Dispense Refill  . amLODipine (NORVASC) 5 MG tablet Take 1 tablet (5 mg total) by mouth daily. 90 tablet 3  . lisinopril (PRINIVIL,ZESTRIL) 40 MG tablet Take 1 tablet (40 mg total) by mouth  daily. 30 tablet 0   No facility-administered medications prior to visit.     Allergies: No Known Allergies  Social History   Social History  . Marital status: Single    Spouse name: N/A  . Number of children: N/A  . Years of education: N/A   Occupational History  . Not on file.   Social History Main Topics  . Smoking status: Never Smoker  . Smokeless tobacco: Never Used  . Alcohol use No     Comment: occasional  . Drug use: No  . Sexual activity: Not on file   Other Topics Concern  . Not on file   Social History Narrative  . No narrative on file    Family History  Problem Relation Age of Onset  . Breast cancer Mother   . Healthy Father   . Healthy Sister   . Healthy Brother   . Healthy Sister   . Healthy Brother     Physical Exam: Blood pressure 134/86, pulse 80, temperature 98.1 F (36.7 C), temperature source Oral, resp. rate 18, weight 287 lb (130.2 kg).  General:  Obese AAM. Appears in no acute distress. Neck: Supple. Trachea midline. No thyromegaly. Full ROM. No lymphadenopathy. Lungs: Clear to auscultation bilaterally without wheezes, rales, or rhonchi. Breathing is of normal effort and unlabored. Cardiovascular: RRR with S1 S2. No murmurs, rubs, or gallops. Musculoskeletal: Full range of motion and 5/5 strength throughout.   Skin: Warm and moist. Neuro: A+Ox3. CN II-XII grossly intact. Moves all extremities spontaneously. Normal gait. Psych:  Responds to questions appropriately with a normal affect.   Assessment/Plan:  35 y.o. y/o AA male here for  Essential hypertension Blood pressure now at goal. Continue current medications. Check lab to monitor. Follow-up office visit in 6 months or sooner if needed.  Hyperlipidemia Complete physical exam 07/21/2015 patient reported history of high cholesterol that hip never required medication. At that visit lipid panel was performed and showed LDL 142. He needs to decrease saturated fats in  diet.  HYPERGLYCEMIA, FASTING -07/21/2015-- Hemoglobin A1c--5.1  Morbid obesity due to excess calories (HCC) Discussed proper diet and also need for exercise/activity for weight loss.   THE FOLLOWING IS COPIED FROM HIS CPE 07/21/2015:  -1. Visit for preventive health examination  A. Screening Labs: - CBC with Differential/Platelet - COMPLETE METABOLIC PANEL WITH GFR - Lipid panel - Hemoglobin A1c - TSH - VITAMIN D 25 Hydroxy (Vit-D Deficiency, Fractures)   B. Screening For Prostate Cancer: --Will start this at age 51  C. Screening For Colorectal Cancer:  Will start this at age 1  D. Immunizations: Flu---------------N/A Tetanus-------- reports that he had tetanus vaccine in 2014 Pneumococcal-------no indication to require this until age 11 Zostaax---------------will discuss at age 18   Routine follow-up office visit 6 months or sooner if needed.   Signed:   [address] [place], [place]  08/04/2015 3:06 PM

## 2015-08-05 LAB — BASIC METABOLIC PANEL WITH GFR
BUN: 16 mg/dL (ref 7–25)
CO2: 24 mmol/L (ref 20–31)
Calcium: 9 mg/dL (ref 8.6–10.3)
Chloride: 105 mmol/L (ref 98–110)
Creat: 1.55 mg/dL — ABNORMAL HIGH (ref 0.60–1.35)
GFR, EST AFRICAN AMERICAN: 66 mL/min (ref >=60)
GFR, Est Non African American: 57 mL/min — ABNORMAL LOW (ref >=60)
Glucose, Bld: 98 mg/dL (ref 70–99)
POTASSIUM: 3.9 mmol/L (ref 3.5–5.3)
SODIUM: 140 mmol/L (ref 135–146)

## 2015-10-31 ENCOUNTER — Ambulatory Visit: Payer: Self-pay | Admitting: Family Medicine

## 2016-02-06 ENCOUNTER — Ambulatory Visit: Payer: Self-pay | Admitting: Physician Assistant

## 2016-02-09 ENCOUNTER — Ambulatory Visit (INDEPENDENT_AMBULATORY_CARE_PROVIDER_SITE_OTHER): Payer: BLUE CROSS/BLUE SHIELD | Admitting: Physician Assistant

## 2016-02-09 ENCOUNTER — Encounter: Payer: Self-pay | Admitting: Physician Assistant

## 2016-02-09 VITALS — BP 170/110 | HR 83 | Temp 98.8°F | Resp 18 | Wt 301.2 lb

## 2016-02-09 DIAGNOSIS — E559 Vitamin D deficiency, unspecified: Secondary | ICD-10-CM

## 2016-02-09 DIAGNOSIS — I10 Essential (primary) hypertension: Secondary | ICD-10-CM | POA: Diagnosis not present

## 2016-02-09 MED ORDER — LISINOPRIL 40 MG PO TABS
40.0000 mg | ORAL_TABLET | Freq: Every day | ORAL | 0 refills | Status: DC
Start: 2016-02-09 — End: 2016-04-02

## 2016-02-09 MED ORDER — AMLODIPINE BESYLATE 5 MG PO TABS: 5.0000 mg | ORAL_TABLET | Freq: Every day | ORAL | 3 refills | Status: DC

## 2016-02-09 NOTE — Progress Notes (Signed)
Patient ID: Carlos Griffin MRN: [id number], DOB: January 21, 1980 36 y.o. Date of Encounter: 02/09/2016, 3:32 PM    Chief Complaint: F/U HTN  HPI: 36 y.o. y/o AA male here for f/u HTN.    07/21/2015: Reviewed that he is taking lisinopril 20 mg daily on his medication list. Verified-- he states that he IS taking this every day as directed. Says that he has been on this for 4-5 months. Says that he was seeing Dr. Felecia Shelling in [place] regarding this. Says that he has not been on any other blood pressure medicines in the past and has had no adverse effects to any other blood pressure medicines.  Also reviewed that his problem list includes hyperlipidemia. Says that this is just been treated with "diet" and has never been on medicines for this. Asked when this was last checked and he says at his wellness program at work at around January of this year.  Also discussed his weight and his diet regarding the hyperlipidemia. Asked whether his diet is better worse or the same. He says it has improved some. I also asked if his weight is about the same or if it is much different than it was 5 years ago. Says that it's about the same.  Discussed that his blood pressure is reading high today. States that he has checked his blood pressure at work and at home and has been getting similar readings.  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 His 45 year old son lives with him. It is just the 2 of them. Does make mention that his son's mother lives in [place].  AT THAT OV: --Checked screening labs --Blood pressure was elevated. ------ increased lisinopril from 20 mg to 40 mg daily ------ added Norvasc 5 mg daily   08/04/2015: Today he reports that he did increase the lisinopril and is taking the 40 mg dose now. He also added the Norvasc 5 mg daily. He is having no adverse effects. Today I reviewed the lab results from last visit. He states that he never  was informed of the results and I see that we left a message for him to call back the he does not seem aware of this. Nonetheless today I have told him to start vitamin D 2000 units daily. Reviewed that his other labs looked good. A1c normal at 5.1. TSH normal CBC normal. LDL was slightly elevated at 142. He has no complaints or concerns today.  Assessment/Plan:  36 y.o. y/o AA male here for  Essential hypertension Blood pressure now at goal. Continue current medications. Check lab to monitor. Follow-up office visit in 6 months or sooner if needed.  Hyperlipidemia Complete physical exam 07/21/2015 patient reported history of high cholesterol that hip never required medication. At that visit lipid panel was performed and showed LDL 142. He needs to decrease saturated fats in diet.  HYPERGLYCEMIA, FASTING -07/21/2015-- Hemoglobin A1c--5.1  Morbid obesity due to excess calories (HCC) Discussed proper diet and also need for exercise/activity for weight loss.    02/09/2016: He reports that he has been taking the lisinopril but has not been taking the amlodipine recently. When I asked him why, he says that when he went to the pharmacy they just had one bottle for him so he has been taking that and since they didn't have a second bottle for him, he thought he just wasn't supposed to be taking that one anymore. States that he  is taking the vitamin D as directed and is still taking this every day. Has not had his blood pressure checked anywhere since his last visit here. Has no specific complaints or concerns to discuss today.  Review of Systems: Consitutional: No fever, chills, fatigue, night sweats, lymphadenopathy, or weight changes. Eyes: No visual changes, eye redness, or discharge. ENT/Mouth: Ears: No otalgia, tinnitus, hearing loss, discharge. Nose: No congestion, rhinorrhea, sinus pain, or epistaxis. Throat: No sore throat, post nasal drip, or teeth pain. Cardiovascular: No CP,  palpitations, diaphoresis, DOE, edema, orthopnea, PND. Respiratory: No cough, hemoptysis, SOB, or wheezing. Gastrointestinal: No anorexia, dysphagia, reflux, pain, nausea, vomiting, hematemesis, diarrhea, constipation, BRBPR, or melena. Genitourinary: No dysuria, frequency, urgency, hematuria, incontinence, nocturia, decreased urinary stream, discharge, impotence, or testicular pain/masses. Musculoskeletal: No decreased ROM, myalgias, stiffness, joint swelling, or weakness. Skin: No rash, erythema, lesion changes, pain, warmth, jaundice, or pruritis. Neurological: No headache, dizziness, syncope, seizures, tremors, memory loss, coordination problems, or paresthesias. Psychological: No anxiety, depression, hallucinations, SI/HI. Endocrine: No fatigue, polydipsia, polyphagia, polyuria, or known diabetes. All other systems were reviewed and are otherwise negative.  No past medical history on file.   No past surgical history on file.  Home Meds:  Outpatient Medications Prior to Visit  Medication Sig Dispense Refill  . Cholecalciferol (VITAMIN D) 2000 units CAPS Take 1 capsule (2,000 Units total) by mouth daily. 30 capsule 11  . HYDROcodone-acetaminophen (NORCO/VICODIN) 5-325 MG tablet     . lisinopril (PRINIVIL,ZESTRIL) 40 MG tablet Take 1 tablet (40 mg total) by mouth daily. 30 tablet 0  . amLODipine (NORVASC) 5 MG tablet Take 1 tablet (5 mg total) by mouth daily. (Patient not taking: Reported on 02/09/2016) 90 tablet 3  . amoxicillin (AMOXIL) 500 MG tablet      No facility-administered medications prior to visit.     Allergies: No Known Allergies  Social History   Social History  . Marital status: Single    Spouse name: N/A  . Number of children: N/A  . Years of education: N/A   Occupational History  . Not on file.   Social History Main Topics  . Smoking status: Never Smoker  . Smokeless tobacco: Never Used  . Alcohol use No     Comment: occasional  . Drug use: No  . Sexual  activity: Not on file   Other Topics Concern  . Not on file   Social History Narrative  . No narrative on file    Family History  Problem Relation Age of Onset  . Breast cancer Mother   . Healthy Father   . Healthy Sister   . Healthy Brother   . Healthy Sister   . Healthy Brother     Physical Exam: Blood pressure (!) 170/110, pulse 83, temperature 98.8 F (37.1 C), temperature source Oral, resp. rate 18, weight (!) 301 lb 3.2 oz (136.6 kg), SpO2 99 %.  General:  Obese AAM. Appears in no acute distress. Neck: Supple. Trachea midline. No thyromegaly. Full ROM. No lymphadenopathy. Lungs: Clear to auscultation bilaterally without wheezes, rales, or rhonchi. Breathing is of normal effort and unlabored. Cardiovascular: RRR with S1 S2. No murmurs, rubs, or gallops. Musculoskeletal: Full range of motion and 5/5 strength throughout.   Skin: Warm and moist. Neuro: A+Ox3. CN II-XII grossly intact. Moves all extremities spontaneously. Normal gait. Psych:  Responds to questions appropriately with a normal affect.   Assessment/Plan:  35 y.o. y/o AA male here for  1. Essential hypertension He is to take both  of his blood pressure medications daily as directed. He is to schedule follow-up visit in 2 weeks to recheck blood pressure on medications to make sure that this is well-controlled/at goal. Discussed necessity for medication compliance and that he should never stop either of these blood pressure medicines unless instructed by a medical provider to do so. Also discussed risk of acute stroke secondary to such elevated blood pressure. Also discussed long-term effects to his health with chronic uncontrolled hypertension. - BASIC METABOLIC PANEL WITH GFR - amLODipine (NORVASC) 5 MG tablet; Take 1 tablet (5 mg total) by mouth daily.  Dispense: 30 tablet; Refill: 3 - lisinopril (PRINIVIL,ZESTRIL) 40 MG tablet; Take 1 tablet (40 mg total) by mouth daily.  Dispense: 30 tablet; Refill: 0  2.  OBESITY, MORBID He has been educated regarding proper diet and exercise on multiple occasions by multiple providers. However has had no weight loss.  3. Vitamin D deficiency At lab 07/2015 vitamin D level was low. At that time started over-the-counter vitamin D which he states he is taking daily since then. Will recheck vitamin D level at this time to monitor. - VITAMIN D 25 Hydroxy (Vit-D Deficiency, Fractures)   Follow up office visit 2 weeks.   THE FOLLOWING IS COPIED FROM HIS CPE 07/21/2015:  -1. Visit for preventive health examination  A. Screening Labs: - CBC with Differential/Platelet - COMPLETE METABOLIC PANEL WITH GFR - Lipid panel - Hemoglobin A1c - TSH - VITAMIN D 25 Hydroxy (Vit-D Deficiency, Fractures)   B. Screening For Prostate Cancer: --Will start this at age 36  C. Screening For Colorectal Cancer:  Will start this at age 36  D. Immunizations: Flu---------------N/A Tetanus-------- reports that he had tetanus vaccine in 2014 Pneumococcal-------no indication to require this until age 36 Zostaax---------------will discuss at age 36     Signed:   [address]Mary Beth GoodridgeDixon,[place], [place]  02/09/2016 3:32 PM

## 2016-02-10 LAB — BASIC METABOLIC PANEL WITH GFR
BUN: 17 mg/dL (ref 7–25)
CHLORIDE: 103 mmol/L (ref 98–110)
CO2: 26 mmol/L (ref 20–31)
CREATININE: 1.49 mg/dL — AB (ref 0.60–1.35)
Calcium: 9 mg/dL (ref 8.6–10.3)
GFR, Est African American: 69 mL/min (ref >=60)
GFR, Est Non African American: 60 mL/min (ref >=60)
Glucose, Bld: 91 mg/dL (ref 70–99)
Potassium: 3.8 mmol/L (ref 3.5–5.3)
SODIUM: 140 mmol/L (ref 135–146)

## 2016-02-10 LAB — VITAMIN D 25 HYDROXY (VIT D DEFICIENCY, FRACTURES): Vit D, 25-Hydroxy: 26 ng/mL — ABNORMAL LOW (ref 30–100)

## 2016-02-14 ENCOUNTER — Other Ambulatory Visit: Payer: Self-pay

## 2016-02-14 MED ORDER — VITAMIN D3 75 MCG (3000 UT) PO TABS: 1.0000 | ORAL_TABLET | Freq: Every day | ORAL | 3 refills | Status: AC

## 2016-02-19 ENCOUNTER — Emergency Department
Admission: EM | Admit: 2016-02-19 | Discharge: 2016-02-19 | Disposition: A | Discharge status: Home / Self Care | Payer: Managed Care, Other (non HMO) | Attending: Emergency Medicine | Admitting: Emergency Medicine

## 2016-02-19 ENCOUNTER — Encounter: Payer: Self-pay | Admitting: Emergency Medicine

## 2016-02-19 DIAGNOSIS — R45851 Suicidal ideations: Secondary | ICD-10-CM

## 2016-02-19 DIAGNOSIS — R258 Other abnormal involuntary movements: Secondary | ICD-10-CM | POA: Insufficient documentation

## 2016-02-19 DIAGNOSIS — Z79899 Other long term (current) drug therapy: Secondary | ICD-10-CM | POA: Insufficient documentation

## 2016-02-19 DIAGNOSIS — I10 Essential (primary) hypertension: Secondary | ICD-10-CM | POA: Insufficient documentation

## 2016-02-19 HISTORY — DX: Essential (primary) hypertension: I10

## 2016-02-19 LAB — CBC
HEMATOCRIT: 44.1 % (ref 40.0–52.0)
HEMOGLOBIN: 15.2 g/dL (ref 13.0–18.0)
MCH: 30.9 pg (ref 26.0–34.0)
MCHC: 34.4 g/dL (ref 32.0–36.0)
MCV: 89.9 fL (ref 80.0–100.0)
Platelets: 329 10*3/uL (ref 150–440)
RBC: 4.91 MIL/uL (ref 4.40–5.90)
RDW: 13.6 % (ref 11.5–14.5)
WBC: 7.2 10*3/uL (ref 3.8–10.6)

## 2016-02-19 LAB — COMPREHENSIVE METABOLIC PANEL
ALBUMIN: 4.5 g/dL (ref 3.5–5.0)
ALK PHOS: 51 U/L (ref 38–126)
ALT: 22 U/L (ref 17–63)
ANION GAP: 8 (ref 5–15)
AST: 24 U/L (ref 15–41)
BUN: 17 mg/dL (ref 6–20)
CALCIUM: 9.3 mg/dL (ref 8.9–10.3)
CO2: 28 mmol/L (ref 22–32)
CREATININE: 1.53 mg/dL — AB (ref 0.61–1.24)
Chloride: 103 mmol/L (ref 101–111)
GFR calc Af Amer: >60 mL/min (ref >60)
GFR calc non Af Amer: 57 mL/min — ABNORMAL LOW (ref >60)
GLUCOSE: 112 mg/dL — AB (ref 65–99)
Potassium: 3.7 mmol/L (ref 3.5–5.1)
SODIUM: 139 mmol/L (ref 135–145)
Total Bilirubin: 0.9 mg/dL (ref 0.3–1.2)
Total Protein: 8.2 g/dL — ABNORMAL HIGH (ref 6.5–8.1)

## 2016-02-19 LAB — URINE DRUG SCREEN, QUALITATIVE (ARMC ONLY)
AMPHETAMINES, UR SCREEN: NOT DETECTED
BARBITURATES, UR SCREEN: NOT DETECTED
Benzodiazepine, Ur Scrn: NOT DETECTED
COCAINE METABOLITE, UR ~~LOC~~: NOT DETECTED
Cannabinoid 50 Ng, Ur ~~LOC~~: NOT DETECTED
MDMA (Ecstasy)Ur Screen: NOT DETECTED
METHADONE SCREEN, URINE: NOT DETECTED
OPIATE, UR SCREEN: NOT DETECTED
PHENCYCLIDINE (PCP) UR S: NOT DETECTED
Tricyclic, Ur Screen: NOT DETECTED

## 2016-02-19 LAB — ACETAMINOPHEN LEVEL

## 2016-02-19 LAB — ETHANOL: Alcohol, Ethyl (B): <5 mg/dL (ref <5)

## 2016-02-19 LAB — SALICYLATE LEVEL: Salicylate Lvl: <7 mg/dL (ref 2.8–30.0)

## 2016-02-19 NOTE — Discharge Instructions (Signed)
Please return immediately if condition worsens. Please contact her primary physician or the physician you were given for referral. If you have any specialist physicians involved in her treatment and plan please also contact them. Thank you for using [facility]. ° °

## 2016-02-19 NOTE — ED Notes (Signed)
Being discharged by [facility]. [facility] papers rescinded.

## 2016-02-19 NOTE — ED Notes (Signed)
[facility] called to initiate SOC 1719

## 2016-02-19 NOTE — ED Notes (Signed)
tts in with pt now 
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
## 2016-02-19 NOTE — ED Notes (Signed)
Family (sister Zenon MayoSheena, girlfriend Zenon MayoSheena, and father Lennox GrumblesRudy) in the lobby wanting to speak with psychiatrist regarding pt. Per pt he does not want me to give then any information. According to family pt lives with girlfriend and her children. Children found a suicide note that pt left for the girlfriend. Family also states pt was missing last night and that is when they called the police. Per police pt was aggressive with them. Calm and cooperative here denying si/hi.
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
## 2016-02-19 NOTE — ED Notes (Signed)
Pt talking to tele psych.
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
## 2016-02-23 ENCOUNTER — Ambulatory Visit: Payer: BLUE CROSS/BLUE SHIELD | Admitting: Physician Assistant

## 2016-03-08 ENCOUNTER — Encounter: Payer: Self-pay | Admitting: Physician Assistant

## 2016-04-02 ENCOUNTER — Other Ambulatory Visit: Payer: Self-pay | Admitting: Physician Assistant

## 2016-04-02 DIAGNOSIS — I10 Essential (primary) hypertension: Secondary | ICD-10-CM

## 2016-04-03 NOTE — Telephone Encounter (Signed)
Rx refilled patient need f/u appointment letter mailed

## 2016-04-11 ENCOUNTER — Encounter: Payer: Self-pay | Admitting: Intensive Care

## 2016-04-11 ENCOUNTER — Emergency Department: Payer: No Typology Code available for payment source

## 2016-04-11 ENCOUNTER — Emergency Department
Admission: EM | Admit: 2016-04-11 | Discharge: 2016-04-11 | Disposition: A | Discharge status: Home / Self Care | Payer: No Typology Code available for payment source | Attending: Emergency Medicine | Admitting: Emergency Medicine

## 2016-04-11 ENCOUNTER — Encounter: Payer: Self-pay | Admitting: Physician Assistant

## 2016-04-11 DIAGNOSIS — Z79899 Other long term (current) drug therapy: Secondary | ICD-10-CM | POA: Diagnosis not present

## 2016-04-11 DIAGNOSIS — Y999 Unspecified external cause status: Secondary | ICD-10-CM | POA: Diagnosis not present

## 2016-04-11 DIAGNOSIS — Y92410 Unspecified street and highway as the place of occurrence of the external cause: Secondary | ICD-10-CM | POA: Insufficient documentation

## 2016-04-11 DIAGNOSIS — M25461 Effusion, right knee: Secondary | ICD-10-CM

## 2016-04-11 DIAGNOSIS — S8991XA Unspecified injury of right lower leg, initial encounter: Secondary | ICD-10-CM | POA: Diagnosis present

## 2016-04-11 DIAGNOSIS — I10 Essential (primary) hypertension: Secondary | ICD-10-CM | POA: Insufficient documentation

## 2016-04-11 DIAGNOSIS — Y9389 Activity, other specified: Secondary | ICD-10-CM | POA: Diagnosis not present

## 2016-04-11 MED ORDER — NAPROXEN 500 MG PO TABS: 500.0000 mg | ORAL_TABLET | Freq: Two times a day (BID) | ORAL | Status: DC

## 2016-04-11 NOTE — ED Notes (Signed)
AAOx3  Skin warm and dry. NAD.  Ambualtes with easy and steady gait. 
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
## 2016-04-11 NOTE — Discharge Instructions (Signed)
Advised to wear elastic knee support.

## 2016-05-10 ENCOUNTER — Encounter: Payer: Self-pay | Admitting: Family Medicine

## 2016-05-13 ENCOUNTER — Other Ambulatory Visit: Payer: Self-pay | Admitting: Physician Assistant

## 2016-05-13 DIAGNOSIS — I10 Essential (primary) hypertension: Secondary | ICD-10-CM

## 2016-05-14 NOTE — Telephone Encounter (Signed)
Refill appropriate 

## 2016-09-05 ENCOUNTER — Other Ambulatory Visit: Payer: Self-pay | Admitting: Physician Assistant

## 2016-09-05 DIAGNOSIS — I10 Essential (primary) hypertension: Secondary | ICD-10-CM

## 2016-09-11 ENCOUNTER — Ambulatory Visit: Payer: BLUE CROSS/BLUE SHIELD | Admitting: Family Medicine

## 2016-09-17 ENCOUNTER — Encounter: Payer: Self-pay | Admitting: Family Medicine

## 2016-09-17 ENCOUNTER — Ambulatory Visit (INDEPENDENT_AMBULATORY_CARE_PROVIDER_SITE_OTHER): Payer: BLUE CROSS/BLUE SHIELD | Admitting: Family Medicine

## 2016-09-17 VITALS — BP 152/80 | HR 91 | Temp 98.5°F | Resp 16 | Ht 71.0 in | Wt 300.0 lb

## 2016-09-17 DIAGNOSIS — M25561 Pain in right knee: Secondary | ICD-10-CM | POA: Diagnosis not present

## 2016-09-17 DIAGNOSIS — M25461 Effusion, right knee: Secondary | ICD-10-CM

## 2016-09-17 DIAGNOSIS — I10 Essential (primary) hypertension: Secondary | ICD-10-CM | POA: Diagnosis not present

## 2016-09-17 LAB — BASIC METABOLIC PANEL WITH GFR
BUN/Creatinine Ratio: 14 (calc) (ref 6–22)
BUN: 20 mg/dL (ref 7–25)
CALCIUM: 9.2 mg/dL (ref 8.6–10.3)
CHLORIDE: 103 mmol/L (ref 98–110)
CO2: 25 mmol/L (ref 20–32)
Creat: 1.41 mg/dL — ABNORMAL HIGH (ref 0.60–1.35)
GFR, Est African American: 73 mL/min/{1.73_m2} (ref >=60)
GFR, Est Non African American: 63 mL/min/{1.73_m2} (ref >=60)
GLUCOSE: 122 mg/dL — AB (ref 65–99)
POTASSIUM: 3.7 mmol/L (ref 3.5–5.3)
SODIUM: 138 mmol/L (ref 135–146)

## 2016-09-17 LAB — URIC ACID: Uric Acid, Serum: 7.7 mg/dL (ref 4.0–8.0)

## 2016-09-17 MED ORDER — LISINOPRIL 40 MG PO TABS: 40.0000 mg | ORAL_TABLET | Freq: Every day | ORAL | 1 refills | Status: AC

## 2016-09-17 MED ORDER — HYDROCODONE-ACETAMINOPHEN 5-325 MG PO TABS: 1.0000 | ORAL_TABLET | Freq: Four times a day (QID) | ORAL | 0 refills | Status: AC | PRN

## 2016-09-17 MED ORDER — DICLOFENAC SODIUM 75 MG PO TBEC: 75.0000 mg | DELAYED_RELEASE_TABLET | Freq: Two times a day (BID) | ORAL | 0 refills | Status: AC

## 2016-09-17 MED ORDER — AMLODIPINE BESYLATE 10 MG PO TABS: 10.0000 mg | ORAL_TABLET | Freq: Every day | ORAL | 1 refills | Status: DC

## 2016-09-17 NOTE — Assessment & Plan Note (Signed)
Uncontrolled increase amlodipine to 10 mg continue lisinopril 40 mg check his metabolic panel

## 2016-09-17 NOTE — Progress Notes (Signed)
   Subjective:    Patient ID: Carlos Griffin, male    DOB: 15-Nov-1980, 36 y.o.   MRN: [id number]  Patient presents for Knee Pain (right pain off and on for 3 months) Here with right knee pain and swelling on and off for the past 4-5 months. He was seen in the emergency room back in April had a fusion at that time his x-ray only showed tiny osteophyte but otherwise no degenerative arthritis. He was given anti-inflammatory he iced and elevated in the swelling went down. He has had recurrent episodes since then this weekend it was severe. He does work on his feet but has not had any injury to his knee recently or in the past. He has no family history of gout. His only regular medications are lisinopril and amlodipine she states that he is now taking regularly.  He denies any other joint pain or swelling  Review Of Systems:  GEN- denies fatigue, fever, weight loss,weakness, recent illness HEENT- denies eye drainage, change in vision, nasal discharge, CVS- denies chest pain, palpitations RESP- denies SOB, cough, wheeze ABD- denies N/V, change in stools, abd pain GU- denies dysuria, hematuria, dribbling, incontinence MSK- + joint pain, muscle aches, injury Neuro- denies headache, dizziness, syncope, seizure activity       Objective:    BP (!) 152/80   Pulse 91   Temp 98.5 F (36.9 C) (Oral)   Resp 16   Ht 5\' 11"  (1.803 m)   Wt 300 lb (136.1 kg)   SpO2 99%   BMI 41.84 kg/m  GEN- NAD, alert and oriented x3 HEENT- PERRL, EOMI, non injected sclera, pink conjunctiva, MMM, oropharynx clear Neck- Supple, no thyromegaly CVS- RRR, no murmur RESP-CTAB MSK- Right knee- swelling noted, fair ROM, no crepitus pain with flexion, no erythema, no warmth, Left knee normal apperance, FROM, ligaments in tact  EXT- No edema Pulses- Radial, DP- 2+        Assessment & Plan:      Problem List Items Addressed This Visit      Unprioritized   Essential hypertension - Primary    Uncontrolled  increase amlodipine to 10 mg continue lisinopril 40 mg check his metabolic panel      Relevant Medications   amLODipine (NORVASC) 10 MG tablet   lisinopril (PRINIVIL,ZESTRIL) 40 MG tablet   Other Relevant Orders   Basic metabolic panel    Other Visit Diagnoses    Effusion of right knee joint       Unknown cause, r/o Gout, check uric acid, if negative, send to orthopedics to have evaluated with recurrence. Start diclofenac, norco for bedtime #10   Relevant Orders   Uric Acid   Acute pain of right knee          Note: This dictation was prepared with Dragon dictation along with smaller phrase technology. Any transcriptional errors that result from this process are unintentional.

## 2016-09-17 NOTE — Patient Instructions (Addendum)
Norvasc increased to  for blood pressure Take pain medicine and anti-inflammatory We will call with lab results  F/U 2 months PHYSICAL

## 2016-09-18 NOTE — Addendum Note (Signed)
Addended by: Milinda AntisURHAM, Kimble Hitchens F on: 09/18/2016 12:48 PM   Modules accepted: Orders

## 2016-09-19 ENCOUNTER — Encounter: Payer: Self-pay | Admitting: Family Medicine

## 2016-09-24 ENCOUNTER — Ambulatory Visit (INDEPENDENT_AMBULATORY_CARE_PROVIDER_SITE_OTHER): Payer: BLUE CROSS/BLUE SHIELD | Admitting: Orthopedic Surgery

## 2016-09-24 ENCOUNTER — Encounter (INDEPENDENT_AMBULATORY_CARE_PROVIDER_SITE_OTHER): Payer: Self-pay | Admitting: Orthopedic Surgery

## 2016-09-24 ENCOUNTER — Ambulatory Visit (INDEPENDENT_AMBULATORY_CARE_PROVIDER_SITE_OTHER): Payer: BLUE CROSS/BLUE SHIELD

## 2016-09-24 DIAGNOSIS — M25561 Pain in right knee: Secondary | ICD-10-CM

## 2016-09-27 NOTE — Progress Notes (Signed)
Office Visit Note   Patient: Carlos Griffin           Date of Birth: 1980/05/10           MRN: [id number] Visit Date: 09/24/2016 Requested by: Salley Scarlet, MD [address] [place], [place] [address] PCP: Salley Scarlet, MD  Subjective: Chief Complaint  Patient presents with  . Right Knee - Pain    HPI: Carlos Griffin is a 36 year old patient with right knee pain.  Been going on for 5 months.  The pain has been worsening since March.  Localizes the pain anterior and medial.  Denies any discrete injury.  He does report buckling or clicking and popping.  Driving aggravates his knee.  He has taken diclofenac and Norco for the problem.  Tried a brace.  This did not help.  He has throbbing pain in it gives way with walking.  He is had to leave work at times.  He is also had the use the other foot to operate the gas pedal because of his pain.              ROS: All systems reviewed are negative as they relate to the chief complaint within the history of present illness.  Patient denies  fevers or chills.   Assessment & Plan: Visit Diagnoses:  1. Acute pain of right knee     Plan: Impression is right knee pain on the medial aspect with effusion.  Mechanical symptoms are present and radiographs are normal.  Statistically speaking this likely represents meniscal tear on the medial side.  Plan is MRI of the right knee to evaluate for medial meniscal tear.  I'll see him back after that study.  Continue with anti-inflammatory medication and bracing.  Follow-Up Instructions: Return for after MRI.   Orders:  Orders Placed This Encounter  Procedures  . XR KNEE 3 VIEW RIGHT  . MR Knee Right w/o contrast   No orders of the defined types were placed in this encounter.     Procedures: No procedures performed   Clinical Data: No additional findings.  Objective: Vital Signs: There were no vitals taken for this visit.  Physical Exam:   Constitutional: Patient appears  well-developed HEENT:  Head: Normocephalic Eyes:EOM are normal Neck: Normal range of motion Cardiovascular: Normal rate Pulmonary/chest: Effort normal Neurologic: Patient is alert Skin: Skin is warm Psychiatric: Patient has normal mood and affect    Ortho Exam: Orthopedic exam demonstrates full active and passive range of motion of the right knee with medial joint line tenderness stable collateral and cruciate ligaments trace effusion intact extensor mechanism which is nontender no groin pain with internal/external rotation leg and no nerve root tension signs.  No other masses lymph adenopathy or skin changes noted in the knee region  Specialty Comments:  No specialty comments available.  Imaging: No results found.   PMFS History: Patient Active Problem List   Diagnosis Date Noted  . Vitamin D deficiency 07/25/2015  . LOW HDL 04/16/2008  . HYPERGLYCEMIA, FASTING 04/16/2008  . LEG CRAMPS 03/05/2008  . BURN OF UNSPECIFIED DEGREE OF FOREARM 12/16/2006  . Hyperlipidemia 04/25/2006  . OBESITY, MORBID 04/25/2006  . Essential hypertension 04/25/2006  . GERD 04/25/2006   Past Medical History:  Diagnosis Date  . Hypertension     Family History  Problem Relation Age of Onset  . Breast cancer Mother   . Healthy Father   . Healthy Sister   . Healthy Brother   .  Healthy Sister   . Healthy Brother     No past surgical history on file. Social History   Occupational History  . Not on file.   Social History Main Topics  . Smoking status: Never Smoker  . Smokeless tobacco: Never Used  . Alcohol use 0.0 oz/week     Comment: weekends  . Drug use: No  . Sexual activity: Not on file

## 2016-10-10 ENCOUNTER — Ambulatory Visit
Admission: RE | Admit: 2016-10-10 | Discharge: 2016-10-10 | Disposition: A | Discharge status: Home / Self Care | Payer: BLUE CROSS/BLUE SHIELD | Source: Ambulatory Visit | Attending: Orthopedic Surgery | Admitting: Orthopedic Surgery

## 2016-10-10 DIAGNOSIS — M25561 Pain in right knee: Secondary | ICD-10-CM

## 2016-10-15 ENCOUNTER — Encounter (INDEPENDENT_AMBULATORY_CARE_PROVIDER_SITE_OTHER): Payer: Self-pay | Admitting: Orthopedic Surgery

## 2016-10-15 ENCOUNTER — Ambulatory Visit (INDEPENDENT_AMBULATORY_CARE_PROVIDER_SITE_OTHER): Payer: BLUE CROSS/BLUE SHIELD | Admitting: Orthopedic Surgery

## 2016-10-15 DIAGNOSIS — S83241D Other tear of medial meniscus, current injury, right knee, subsequent encounter: Secondary | ICD-10-CM

## 2016-10-16 DIAGNOSIS — S83241D Other tear of medial meniscus, current injury, right knee, subsequent encounter: Secondary | ICD-10-CM | POA: Diagnosis not present

## 2016-10-16 MED ORDER — LIDOCAINE HCL 1 % IJ SOLN
5.0000 mL | INTRAMUSCULAR | Status: AC | PRN
  Administered 2016-10-16: 5 mL

## 2016-10-16 MED ORDER — METHYLPREDNISOLONE ACETATE 40 MG/ML IJ SUSP
40.0000 mg | INTRAMUSCULAR | Status: AC | PRN
  Administered 2016-10-16: 40 mg via INTRA_ARTICULAR

## 2016-10-16 MED ORDER — BUPIVACAINE HCL 0.25 % IJ SOLN
4.0000 mL | INTRAMUSCULAR | Status: AC | PRN
  Administered 2016-10-16: 4 mL via INTRA_ARTICULAR

## 2016-10-16 NOTE — Progress Notes (Signed)
Office Visit Note   Patient: Carlos Griffin           Date of Birth: Mar 17, 1980           MRN: [id number] Visit Date: 10/15/2016 Requested by: Salley Scarlet, MD [address] [place], [place] [address] PCP: Salley Scarlet, MD  Subjective: Chief Complaint  Patient presents with  . Right Knee - Follow-up    HPI: [name] is a 36 year old patient with right knee pain.  Since I have seen him he has had an MRI scan which is reviewed.  He has medial and lateral meniscal tears.  He also has cystic structure posterior to the PCL which appears fluid-filled.  He is taking anti-inflammatories which has not been very helpful.  Describes medial pain with some mechanical symptoms.  He has to go up and down ladders a lot.              ROS: All systems reviewed are negative as they relate to the chief complaint within the history of present illness.  Patient denies  fevers or chills.   Assessment & Plan: Visit Diagnoses:  1. Acute medial meniscal tear, right, subsequent encounter     Plan: Impression is medial and lateral meniscal tears in the right knee.  MRI scan is reviewed with the patient.  Has a small amount of chondral damage on the medial knee which may be giving him his effusion.  Aspiration injection performed today.  Return in 6 weeks for decision for or against arthroscopic debridement at that time.  The risks and benefits of that surgery are discussed today.  We'll talk more about it in 6 weeks  Follow-Up Instructions: Return in about 6 weeks (around 11/26/2016).   Orders:  No orders of the defined types were placed in this encounter.  No orders of the defined types were placed in this encounter.     Procedures: Large Joint Inj Date/Time: 10/16/2016 11:12 PM Performed by: Cammy Copa Authorized by: Cammy Copa   Consent Given by:  Patient Site marked: the procedure site was marked   Timeout: prior to procedure the correct patient, procedure, and site  was verified   Indications:  Pain, joint swelling and diagnostic evaluation Location:  Knee Site:  R knee Prep: patient was prepped and draped in usual sterile fashion   Needle Size:  18 G Needle Length:  1.5 inches Approach:  Superolateral Ultrasound Guidance: No   Fluoroscopic Guidance: No   Arthrogram: No   Medications:  5 mL lidocaine 1 %; 4 mL bupivacaine 0.25 %; 40 mg methylPREDNISolone acetate 40 MG/ML Aspiration Attempted: Yes   Aspirate amount (mL):  20 Aspirate:  Yellow Patient tolerance:  Patient tolerated the procedure well with no immediate complications     Clinical Data: No additional findings.  Objective: Vital Signs: There were no vitals taken for this visit.  Physical Exam:   Constitutional: Patient appears well-developed HEENT:  Head: Normocephalic Eyes:EOM are normal Neck: Normal range of motion Cardiovascular: Normal rate Pulmonary/chest: Effort normal Neurologic: Patient is alert Skin: Skin is warm Psychiatric: Patient has normal mood and affect    Ortho Exam: Orthopedic exam demonstrates trace effusion in the right knee full range of motion medial and lateral joint line tenderness.  Palpable pedal pulses.  No masses lymph adenopathy or skin changes noted in the right knee region.  Range of motion is full.  Collateral and cruciate ligaments stable  Specialty Comments:  No specialty  comments available.  Imaging: No results found.   PMFS History: Patient Active Problem List   Diagnosis Date Noted  . Vitamin D deficiency 07/25/2015  . LOW HDL 04/16/2008  . HYPERGLYCEMIA, FASTING 04/16/2008  . LEG CRAMPS 03/05/2008  . BURN OF UNSPECIFIED DEGREE OF FOREARM 12/16/2006  . Hyperlipidemia 04/25/2006  . OBESITY, MORBID 04/25/2006  . Essential hypertension 04/25/2006  . GERD 04/25/2006   Past Medical History:  Diagnosis Date  . Hypertension     Family History  Problem Relation Age of Onset  . Breast cancer Mother   . Healthy Father   .  Healthy Sister   . Healthy Brother   . Healthy Sister   . Healthy Brother     No past surgical history on file. Social History   Occupational History  . Not on file.   Social History Main Topics  . Smoking status: Never Smoker  . Smokeless tobacco: Never Used  . Alcohol use 0.0 oz/week     Comment: weekends  . Drug use: No  . Sexual activity: Not on file

## 2016-11-28 ENCOUNTER — Ambulatory Visit (INDEPENDENT_AMBULATORY_CARE_PROVIDER_SITE_OTHER): Payer: BLUE CROSS/BLUE SHIELD | Admitting: Orthopedic Surgery

## 2016-12-24 ENCOUNTER — Encounter: Payer: Self-pay | Admitting: Family Medicine

## 2017-01-23 ENCOUNTER — Encounter: Payer: Self-pay | Admitting: Family Medicine

## 2017-02-13 ENCOUNTER — Ambulatory Visit (INDEPENDENT_AMBULATORY_CARE_PROVIDER_SITE_OTHER): Payer: BLUE CROSS/BLUE SHIELD | Admitting: Orthopedic Surgery

## 2017-02-21 ENCOUNTER — Encounter (INDEPENDENT_AMBULATORY_CARE_PROVIDER_SITE_OTHER): Payer: Self-pay | Admitting: Orthopedic Surgery

## 2017-02-21 ENCOUNTER — Ambulatory Visit (INDEPENDENT_AMBULATORY_CARE_PROVIDER_SITE_OTHER): Payer: BLUE CROSS/BLUE SHIELD | Admitting: Orthopedic Surgery

## 2017-02-21 DIAGNOSIS — S83241D Other tear of medial meniscus, current injury, right knee, subsequent encounter: Secondary | ICD-10-CM

## 2017-02-23 ENCOUNTER — Encounter (INDEPENDENT_AMBULATORY_CARE_PROVIDER_SITE_OTHER): Payer: Self-pay | Admitting: Orthopedic Surgery

## 2017-02-23 NOTE — Progress Notes (Signed)
Office Visit Note   Patient: Carlos Griffin           Date of Birth: June 18, 1980           MRN: [id number] Visit Date: 02/21/2017 Requested by: Salley Scarleturham, Kawanta F, MD [address] [place], [place] [id number] PCP: Salley Scarleturham, Kawanta F, MD  Subjective: Chief Complaint  Patient presents with  . Right Knee - Follow-up    HPI: Carlos Griffin is a patient with right knee pain.  He has known medial and lateral meniscal tears.  Had 50% relief from the injection but the knee is beginning to become painful again.  Last month the pain started to recur.  He works standing at [employer].  Occasional mechanical symptoms.  I reviewed the MRI scan with him and he does have meniscal pathology as well as early but not severe compartment where particularly on the distal end of the medial femur.              ROS: All systems reviewed are negative as they relate to the chief complaint within the history of present illness.  Patient denies  fevers or chills.   Assessment & Plan: Visit Diagnoses:  1. Acute medial meniscal tear, right, subsequent encounter     Plan: Impression is medial and lateral meniscal tears in the right knee.  We discussed operative and nonoperative options as well as arthroscopic debridement of that meniscal tear and a pretty reasonable likelihood of pain relief but not complete pain relief.  Patient understands the risk and benefits of surgical intervention and will schedule.  Expected rehab course and time out of work also discussed.  Follow-Up Instructions: No Follow-up on file.   Orders:  No orders of the defined types were placed in this encounter.  No orders of the defined types were placed in this encounter.     Procedures: No procedures performed   Clinical Data: No additional findings.  Objective: Vital Signs: There were no vitals taken for this visit.  Physical Exam:   Constitutional: Patient appears well-developed HEENT:  Head: Normocephalic Eyes:EOM are  normal Neck: Normal range of motion Cardiovascular: Normal rate Pulmonary/chest: Effort normal Neurologic: Patient is alert Skin: Skin is warm Psychiatric: Patient has normal mood and affect    Ortho Exam: Orthopedic exam demonstrates trace effusion in the right knee with good range of motion.  There is medial and lateral joint line tenderness with stable collateral and cruciate ligaments.  Extensor mechanism is intact.  There are no other masses lymphadenopathy or skin changes noted in the right knee region  Specialty Comments:  No specialty comments available.  Imaging: No results found.   PMFS History: Patient Active Problem List   Diagnosis Date Noted  . Vitamin D deficiency 07/25/2015  . LOW HDL 04/16/2008  . HYPERGLYCEMIA, FASTING 04/16/2008  . LEG CRAMPS 03/05/2008  . BURN OF UNSPECIFIED DEGREE OF FOREARM 12/16/2006  . Hyperlipidemia 04/25/2006  . OBESITY, MORBID 04/25/2006  . Essential hypertension 04/25/2006  . GERD 04/25/2006   Past Medical History:  Diagnosis Date  . Hypertension     Family History  Problem Relation Age of Onset  . Breast cancer Mother   . Healthy Father   . Healthy Sister   . Healthy Brother   . Healthy Sister   . Healthy Brother     History reviewed. No pertinent surgical history. Social History   Occupational History  . Not on file  Tobacco Use  . Smoking status: Never  Smoker  . Smokeless tobacco: Never Used  Substance and Sexual Activity  . Alcohol use: Yes    Alcohol/week: 0.0 oz    Comment: weekends  . Drug use: No  . Sexual activity: Not on file

## 2017-03-11 ENCOUNTER — Encounter: Payer: Self-pay | Admitting: Orthopedic Surgery

## 2017-03-11 ENCOUNTER — Encounter (INDEPENDENT_AMBULATORY_CARE_PROVIDER_SITE_OTHER): Payer: Self-pay | Admitting: Orthopedic Surgery

## 2017-03-11 DIAGNOSIS — S83281A Other tear of lateral meniscus, current injury, right knee, initial encounter: Secondary | ICD-10-CM | POA: Diagnosis not present

## 2017-03-11 DIAGNOSIS — M23321 Other meniscus derangements, posterior horn of medial meniscus, right knee: Secondary | ICD-10-CM | POA: Diagnosis not present

## 2017-03-18 ENCOUNTER — Ambulatory Visit (INDEPENDENT_AMBULATORY_CARE_PROVIDER_SITE_OTHER): Payer: BLUE CROSS/BLUE SHIELD | Admitting: Orthopedic Surgery

## 2017-03-18 ENCOUNTER — Encounter (INDEPENDENT_AMBULATORY_CARE_PROVIDER_SITE_OTHER): Payer: Self-pay | Admitting: Orthopedic Surgery

## 2017-03-18 DIAGNOSIS — S83241D Other tear of medial meniscus, current injury, right knee, subsequent encounter: Secondary | ICD-10-CM

## 2017-03-18 NOTE — Progress Notes (Signed)
   Post-Op Visit Note   Patient: Carlos Griffin           Date of Birth: May 15, 1980           MRN: [id number] Visit Date: 03/18/2017 PCP: Salley Scarleturham, Kawanta F, MD   Assessment & Plan:  Chief Complaint:  Chief Complaint  Patient presents with  . Right Knee - Follow-up, Routine Post Op   Visit Diagnoses:  1. Acute medial meniscal tear, right, subsequent encounter     Plan: [name] is now 1 week out right knee arthroscopy and debridement.  Has mild effusion.  That effusion is aspirated today.  We took out about 75 cc.  Injected some Toradol.  Okay to start leg extension leg press and stationary bike.  Come back in 4 weeks for clinical recheck.  He will not be able to go back to work before then.  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  Follow-Up Instructions: Return in about 4 weeks (around 04/15/2017).   Orders:  No orders of the defined types were placed in this encounter.  No orders of the defined types were placed in this encounter.   Imaging: No results found.  PMFS History: Patient Active Problem List   Diagnosis Date Noted  . Vitamin D deficiency 07/25/2015  . LOW HDL 04/16/2008  . HYPERGLYCEMIA, FASTING 04/16/2008  . LEG CRAMPS 03/05/2008  . BURN OF UNSPECIFIED DEGREE OF FOREARM 12/16/2006  . Hyperlipidemia 04/25/2006  . OBESITY, MORBID 04/25/2006  . Essential hypertension 04/25/2006  . GERD 04/25/2006   Past Medical History:  Diagnosis Date  . Hypertension     Family History  Problem Relation Age of Onset  . Breast cancer Mother   . Healthy Father   . Healthy Sister   . Healthy Brother   . Healthy Sister   . Healthy Brother     History reviewed. No pertinent surgical history. Social History   Occupational History  . Not on file  Tobacco Use  . Smoking status: Never Smoker  . Smokeless tobacco: Never Used  Substance and Sexual Activity  . Alcohol use: Yes    Alcohol/week: 0.0 oz    Comment: weekends  . Drug use: No  .  Sexual activity: Not on file

## 2017-03-27 ENCOUNTER — Telehealth (INDEPENDENT_AMBULATORY_CARE_PROVIDER_SITE_OTHER): Payer: Self-pay | Admitting: Orthopedic Surgery

## 2017-03-27 NOTE — Telephone Encounter (Signed)
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  Please call patient to advise. I have letter and will bring back to you.

## 2017-03-28 NOTE — Telephone Encounter (Signed)
I showed Dr August Saucerean the form and per Dr August Saucerean, we are unable to complete this form for patient to return to work because according to the last OV note when patient was seen on 03/18/17 patient needed to come back in 4 weeks for re-evaluation/postop follow up and could not be released to return to work before that time. I called patient and advised. 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  I explained to patient that the form could not be completed b/c it is a job description and return to work release and there was a paper attached with requirement/duties patient must be able to perform (crouch, kneel, squats, 80lb horizontal push, 100lb horizontal pull, and waist to shoulder lift up to 40lbs). He asked if you could reconsider filling this out. Please advise. Thanks.

## 2017-04-18 ENCOUNTER — Ambulatory Visit (INDEPENDENT_AMBULATORY_CARE_PROVIDER_SITE_OTHER): Payer: BLUE CROSS/BLUE SHIELD | Admitting: Orthopedic Surgery

## 2017-04-18 ENCOUNTER — Encounter (INDEPENDENT_AMBULATORY_CARE_PROVIDER_SITE_OTHER): Payer: Self-pay | Admitting: Orthopedic Surgery

## 2017-04-18 DIAGNOSIS — S83241D Other tear of medial meniscus, current injury, right knee, subsequent encounter: Secondary | ICD-10-CM

## 2017-04-20 NOTE — Telephone Encounter (Signed)
Just saw him

## 2017-04-21 ENCOUNTER — Encounter (INDEPENDENT_AMBULATORY_CARE_PROVIDER_SITE_OTHER): Payer: Self-pay | Admitting: Orthopedic Surgery

## 2017-04-21 NOTE — Progress Notes (Signed)
   Post-Op Visit Note   Patient: Carlos Griffin           Date of Birth: 08-26-1980           MRN: [id number] Visit Date: 04/18/2017 PCP: Carlos Griffin, Carlos F, MD   Assessment & Plan:  Chief Complaint:  Chief Complaint  Patient presents with  . Right Knee - Follow-up   Visit Diagnoses:  1. Acute medial meniscal tear, right, subsequent encounter     Plan: Carlos Griffin is a patient is now 5 weeks out right knee arthroscopy partial medial meniscectomy.  Still is unable to kneel down and put pressure on the knee when he is kneeling.  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  On examination he has trace effusion good range of motion and improving quad strength.  No focal joint line tenderness is present.  Plan is [employer] sample box x1 with okay to return to work 1 week from this coming Monday.  Continue with biking and non-loadbearing quad strengthening exercises.  Follow-up with me as needed.  He is doing well following his surgery.  Follow-Up Instructions: Return if symptoms worsen or fail to improve.   Orders:  No orders of the defined types were placed in this encounter.  No orders of the defined types were placed in this encounter.   Imaging: No results found.  PMFS History: Patient Active Problem List   Diagnosis Date Noted  . Vitamin D deficiency 07/25/2015  . LOW HDL 04/16/2008  . HYPERGLYCEMIA, FASTING 04/16/2008  . LEG CRAMPS 03/05/2008  . BURN OF UNSPECIFIED DEGREE OF FOREARM 12/16/2006  . Hyperlipidemia 04/25/2006  . OBESITY, MORBID 04/25/2006  . Essential hypertension 04/25/2006  . GERD 04/25/2006   Past Medical History:  Diagnosis Date  . Hypertension     Family History  Problem Relation Age of Onset  . Breast cancer Mother   . Healthy Father   . Healthy Sister   . Healthy Brother   . Healthy Sister   . Healthy Brother     History reviewed. No pertinent surgical history. Social History   Occupational History  . Not on file  Tobacco Use  .  Smoking status: Never Smoker  . Smokeless tobacco: Never Used  Substance and Sexual Activity  . Alcohol use: Yes    Alcohol/week: 0.0 oz    Comment: weekends  . Drug use: No  . Sexual activity: Not on file

## 2017-04-29 ENCOUNTER — Telehealth (INDEPENDENT_AMBULATORY_CARE_PROVIDER_SITE_OTHER): Payer: Self-pay | Admitting: Orthopedic Surgery

## 2017-04-29 NOTE — Telephone Encounter (Signed)
Patient said he was release back to work by Dr. August Saucerean starting today but his job needs a work note stating he has no restrictions. Please fax to [phone] attn: [employer]

## 2017-04-29 NOTE — Telephone Encounter (Signed)
Faxed note from 04/18/17 office visit

## 2017-04-29 NOTE — Telephone Encounter (Signed)
Patient came in to the clinic stating that the letter has to state "no restrictions" rather than regular duty.  Thank you.

## 2017-04-29 NOTE — Telephone Encounter (Signed)
I corrected and faxed back.

## 2017-05-01 ENCOUNTER — Telehealth (INDEPENDENT_AMBULATORY_CARE_PROVIDER_SITE_OTHER): Payer: Self-pay | Admitting: Orthopedic Surgery

## 2017-05-01 NOTE — Telephone Encounter (Signed)
I don't have anything, they didn't send anything in today's mail either.

## 2017-05-01 NOTE — Telephone Encounter (Signed)
Have either of you seen anything come through on this patient?

## 2017-05-01 NOTE — Telephone Encounter (Signed)
Patient called wanting to know if we had received anything from his short term disability insurance through fax in regards to when he can return back to work. CB # [phone]

## 2017-05-01 NOTE — Telephone Encounter (Signed)
I currently don't have anything

## 2017-05-06 NOTE — Telephone Encounter (Signed)
IC LM advising no forms and that he likely needs to have refaxed or bring by office.

## 2017-10-17 ENCOUNTER — Other Ambulatory Visit: Payer: Self-pay | Admitting: Family Medicine

## 2017-10-17 ENCOUNTER — Other Ambulatory Visit: Payer: Self-pay | Admitting: Physician Assistant

## 2017-10-17 DIAGNOSIS — I10 Essential (primary) hypertension: Secondary | ICD-10-CM

## 2018-08-04 ENCOUNTER — Ambulatory Visit: Payer: BLUE CROSS/BLUE SHIELD | Admitting: Family Medicine

## 2020-07-15 ENCOUNTER — Other Ambulatory Visit: Payer: Self-pay | Admitting: Internal Medicine

## 2020-07-15 DIAGNOSIS — N1831 Chronic kidney disease, stage 3a: Secondary | ICD-10-CM

## 2020-07-15 DIAGNOSIS — N179 Acute kidney failure, unspecified: Secondary | ICD-10-CM

## 2020-09-21 ENCOUNTER — Ambulatory Visit
Admission: RE | Admit: 2020-09-21 | Discharge: 2020-09-21 | Disposition: A | Discharge status: Home / Self Care | Payer: No Typology Code available for payment source | Source: Ambulatory Visit | Attending: Internal Medicine | Admitting: Internal Medicine

## 2020-09-21 DIAGNOSIS — N1831 Chronic kidney disease, stage 3a: Secondary | ICD-10-CM

## 2020-09-21 DIAGNOSIS — N179 Acute kidney failure, unspecified: Secondary | ICD-10-CM

## 2022-11-27 ENCOUNTER — Ambulatory Visit: Payer: Self-pay

## 2023-01-10 IMAGING — US US RENAL
1 series · 14 of 25 positions shown · non-contrast
Comparison: None.

CLINICAL DATA: Acute kidney injury, chronic renal disease

EXAM:
RENAL / URINARY TRACT ULTRASOUND COMPLETE

[Series 1: us renal · 0.28mm/px · 14 of 34 slices shown]
[im 1/34]
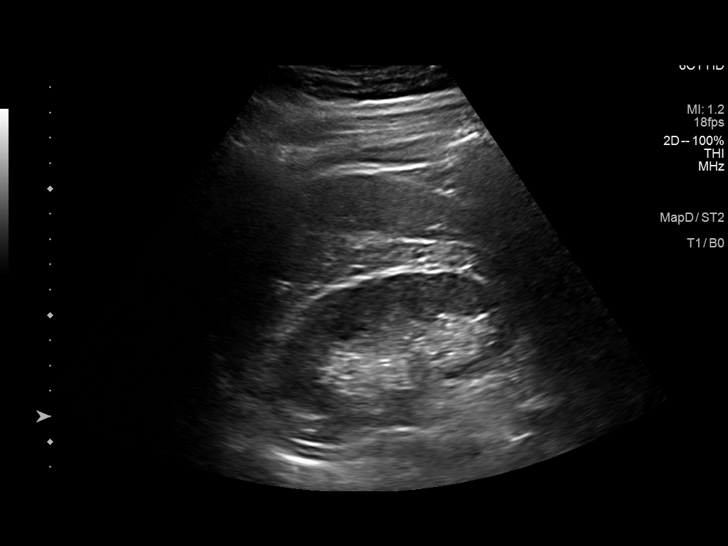
[im 3/34]
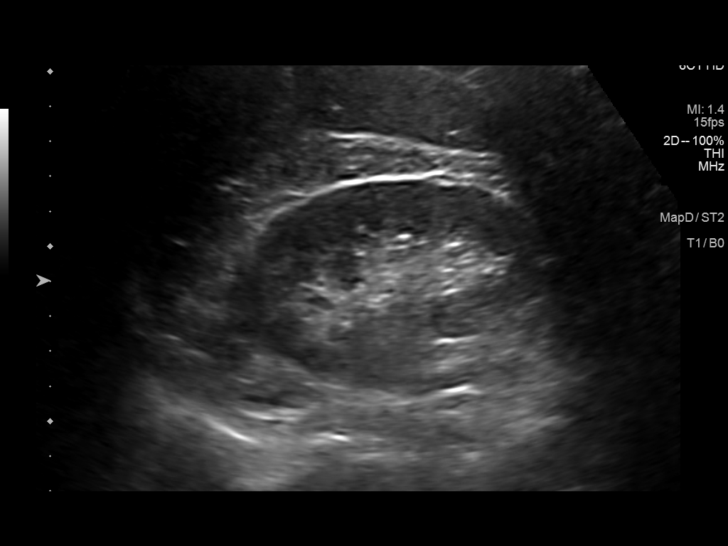
[im 6/34]
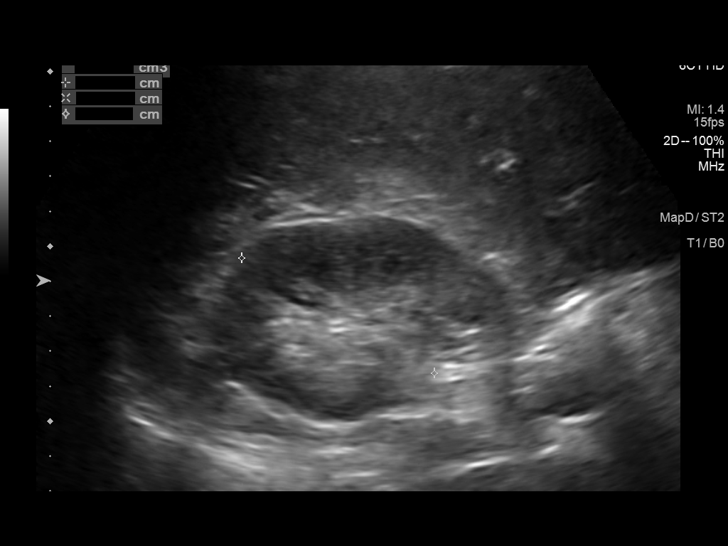
[im 9/34]
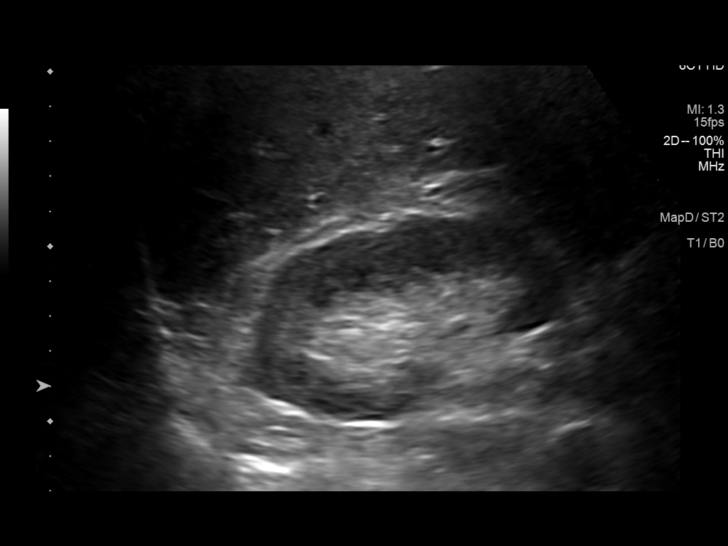
[im 12/34]
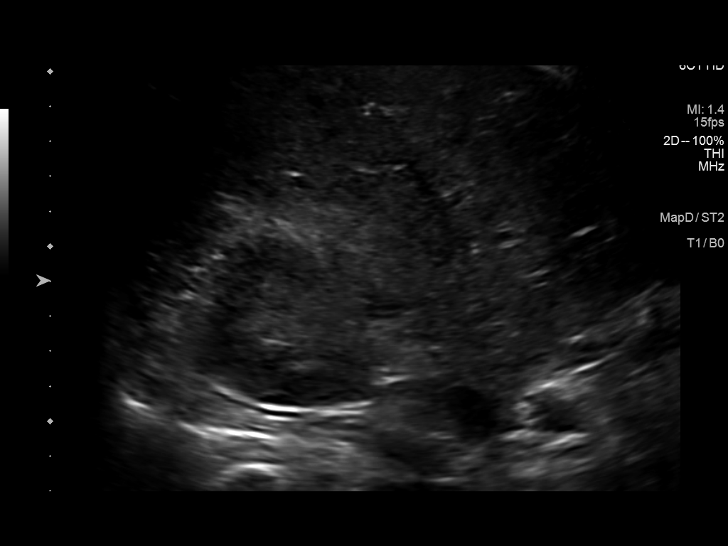
[im 13/34]
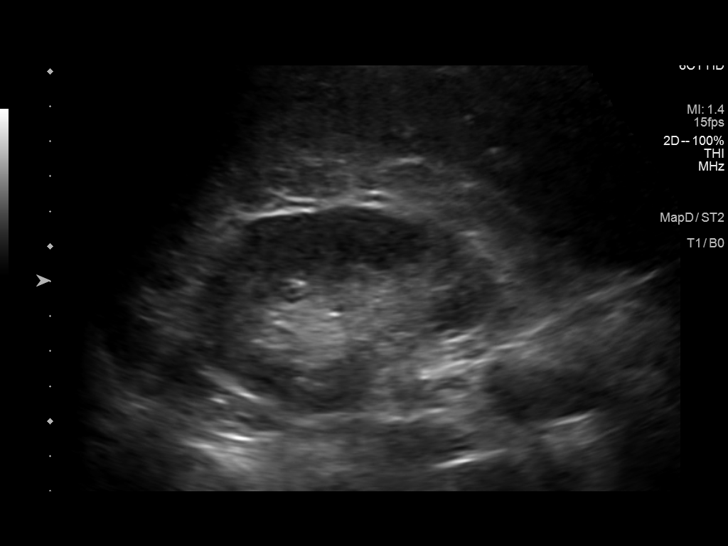
[im 16/34]
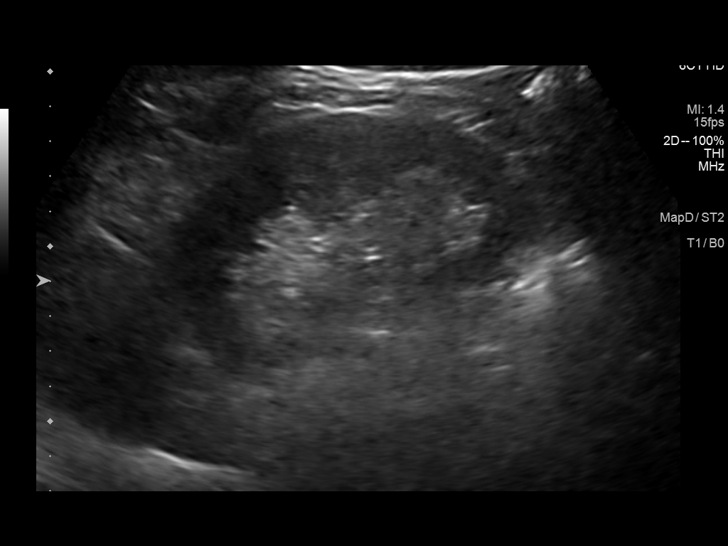
[im 18/34]
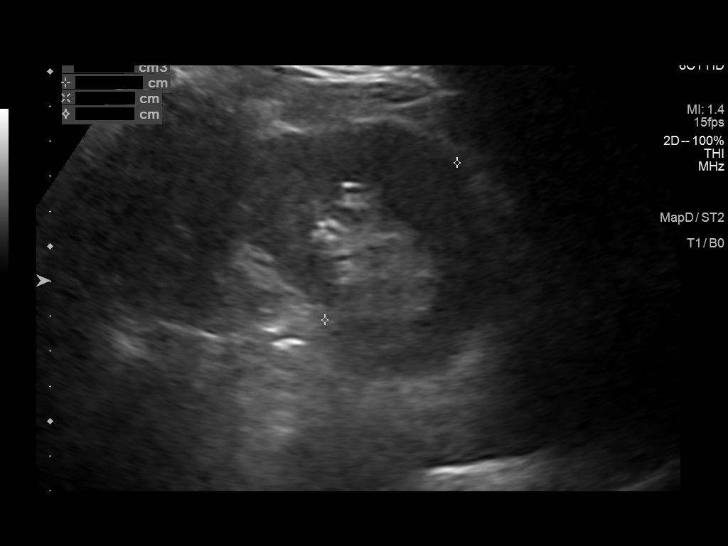
[im 21/34]
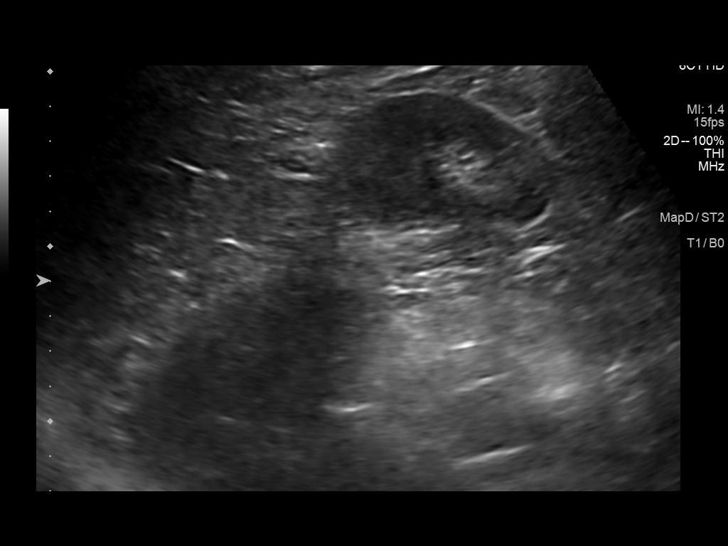
[im 23/34]
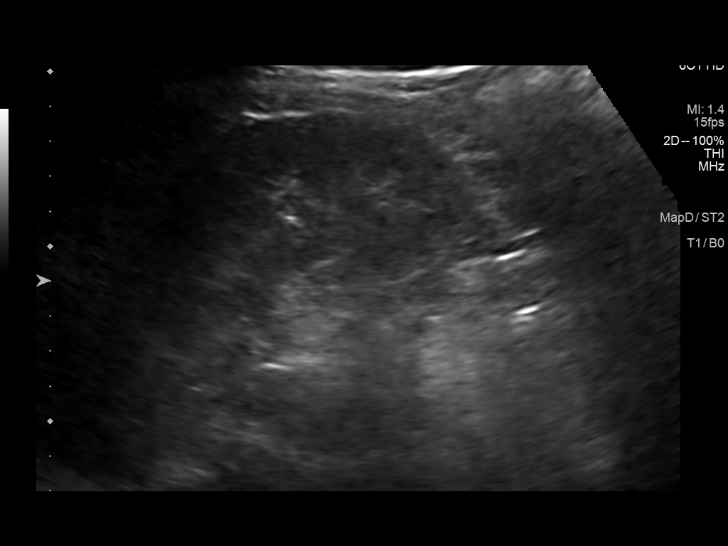
[im 25/34]
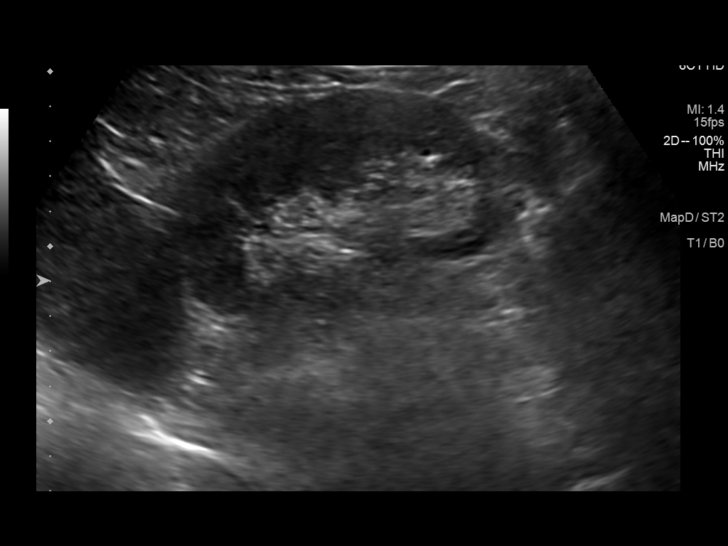
[im 28/34]
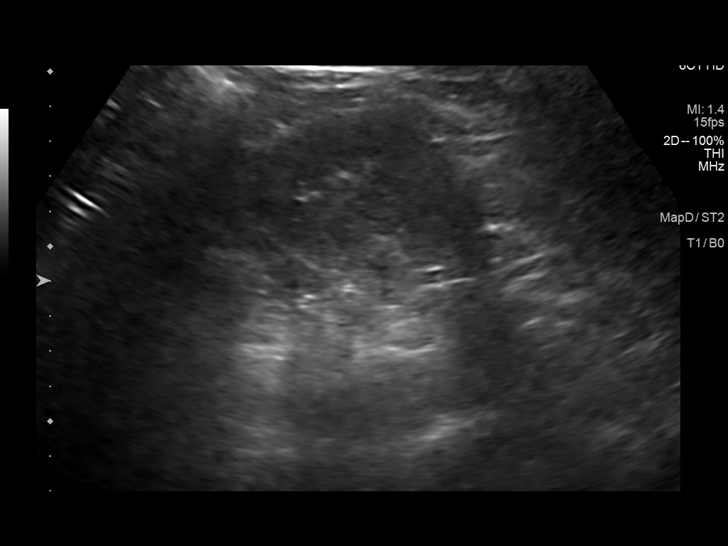
[im 31/34]
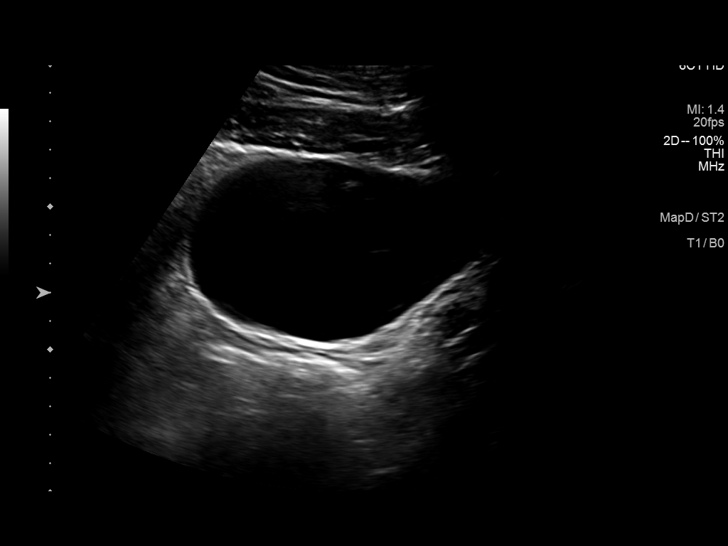
[im 34/34]
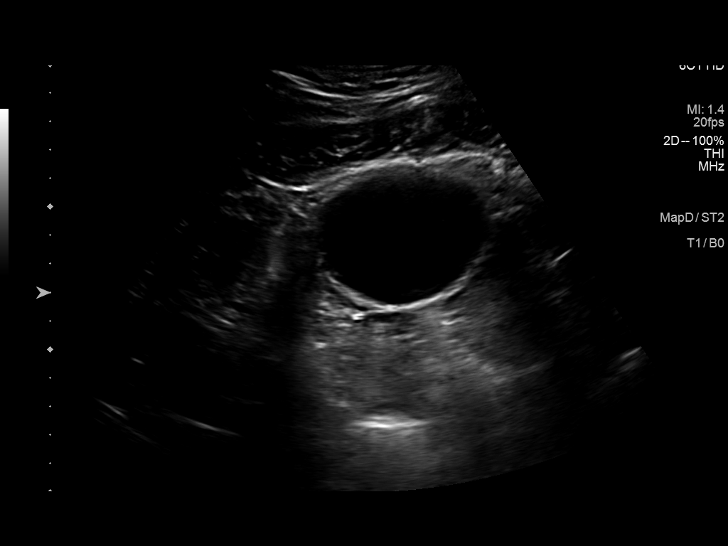

[14 of 25 positions shown; findings below may reference images not displayed]

FINDINGS: Right Kidney:

Renal measurements: 10.0 x 4.9 x 6.4 cm = volume: 165 mL.
Echogenicity within normal limits. No mass or hydronephrosis
visualized.

Left Kidney:

Renal measurements: 10.6 x 5.9 x 5.9 cm = volume: 192 mL.
Echogenicity within normal limits. No mass or hydronephrosis
visualized.

Bladder:

Appears normal for degree of bladder distention.

Other:

None.
IMPRESSION: No ultrasound abnormality of the kidneys.  No hydronephrosis.

## 2023-05-31 ENCOUNTER — Other Ambulatory Visit: Payer: Self-pay | Admitting: Neonatal-Perinatal Medicine

## 2023-05-31 ENCOUNTER — Other Ambulatory Visit: Payer: Self-pay | Admitting: Internal Medicine

## 2023-05-31 DIAGNOSIS — I10 Essential (primary) hypertension: Secondary | ICD-10-CM

## 2023-06-21 ENCOUNTER — Ambulatory Visit
Admission: RE | Admit: 2023-06-21 | Discharge: 2023-06-21 | Disposition: A | Discharge status: Home / Self Care | Source: Ambulatory Visit | Attending: Internal Medicine | Admitting: Internal Medicine

## 2023-06-21 DIAGNOSIS — I10 Essential (primary) hypertension: Secondary | ICD-10-CM
